# Patient Record
Sex: Female | Born: 1945 | Race: White | Hispanic: No | Marital: Married | Smoking: Never smoker
Health system: Southern US, Community
[De-identification: ages and names within clinical notes are randomized; demographics above are authoritative.]

## PROBLEM LIST (undated history)

## (undated) DIAGNOSIS — G4730 Sleep apnea, unspecified: Secondary | ICD-10-CM

## (undated) DIAGNOSIS — Z8489 Family history of other specified conditions: Secondary | ICD-10-CM

## (undated) DIAGNOSIS — M1990 Unspecified osteoarthritis, unspecified site: Secondary | ICD-10-CM

## (undated) DIAGNOSIS — I480 Paroxysmal atrial fibrillation: Secondary | ICD-10-CM

## (undated) DIAGNOSIS — Z98811 Dental restoration status: Secondary | ICD-10-CM

## (undated) DIAGNOSIS — E039 Hypothyroidism, unspecified: Secondary | ICD-10-CM

## (undated) DIAGNOSIS — J45909 Unspecified asthma, uncomplicated: Secondary | ICD-10-CM

## (undated) DIAGNOSIS — S82853A Displaced trimalleolar fracture of unspecified lower leg, initial encounter for closed fracture: Secondary | ICD-10-CM

## (undated) HISTORY — PX: ABDOMINAL HYSTERECTOMY: SHX81

## (undated) HISTORY — PX: TURBINATE REDUCTION: SHX6157

## (undated) HISTORY — PX: CATARACT EXTRACTION W/ INTRAOCULAR LENS  IMPLANT, BILATERAL: SHX1307

---

## 1999-01-22 ENCOUNTER — Other Ambulatory Visit: Admission: RE | Admit: 1999-01-22 | Discharge: 1999-01-22 | Payer: Self-pay | Admitting: Gynecology

## 1999-03-02 ENCOUNTER — Encounter: Admission: RE | Admit: 1999-03-02 | Discharge: 1999-03-02 | Payer: Self-pay | Admitting: Gynecology

## 1999-03-02 ENCOUNTER — Encounter: Payer: Self-pay | Admitting: Gynecology

## 2000-05-28 ENCOUNTER — Encounter: Payer: Self-pay | Admitting: Gynecology

## 2000-05-28 ENCOUNTER — Other Ambulatory Visit: Admission: RE | Admit: 2000-05-28 | Discharge: 2000-05-28 | Payer: Self-pay | Admitting: Gynecology

## 2000-05-28 ENCOUNTER — Encounter: Admission: RE | Admit: 2000-05-28 | Discharge: 2000-05-28 | Payer: Self-pay | Admitting: Gynecology

## 2000-12-30 ENCOUNTER — Encounter: Payer: Self-pay | Admitting: Gynecology

## 2000-12-30 ENCOUNTER — Encounter: Admission: RE | Admit: 2000-12-30 | Discharge: 2000-12-30 | Payer: Self-pay | Admitting: Gynecology

## 2001-11-19 ENCOUNTER — Encounter (INDEPENDENT_AMBULATORY_CARE_PROVIDER_SITE_OTHER): Payer: Self-pay | Admitting: Specialist

## 2001-11-19 ENCOUNTER — Ambulatory Visit (HOSPITAL_COMMUNITY): Admission: RE | Admit: 2001-11-19 | Discharge: 2001-11-19 | Payer: Self-pay | Admitting: Gastroenterology

## 2002-03-17 ENCOUNTER — Encounter: Admission: RE | Admit: 2002-03-17 | Discharge: 2002-03-17 | Payer: Self-pay | Admitting: Gynecology

## 2002-03-17 ENCOUNTER — Encounter: Payer: Self-pay | Admitting: Gynecology

## 2002-03-17 ENCOUNTER — Other Ambulatory Visit: Admission: RE | Admit: 2002-03-17 | Discharge: 2002-03-17 | Payer: Self-pay | Admitting: Gynecology

## 2003-03-21 ENCOUNTER — Other Ambulatory Visit: Admission: RE | Admit: 2003-03-21 | Discharge: 2003-03-21 | Payer: Self-pay | Admitting: Gynecology

## 2003-03-22 ENCOUNTER — Encounter: Admission: RE | Admit: 2003-03-22 | Discharge: 2003-03-22 | Payer: Self-pay | Admitting: Gynecology

## 2004-03-21 ENCOUNTER — Other Ambulatory Visit: Admission: RE | Admit: 2004-03-21 | Discharge: 2004-03-21 | Payer: Self-pay | Admitting: Gynecology

## 2004-03-22 ENCOUNTER — Encounter: Admission: RE | Admit: 2004-03-22 | Discharge: 2004-03-22 | Payer: Self-pay | Admitting: Gynecology

## 2005-03-25 ENCOUNTER — Other Ambulatory Visit: Admission: RE | Admit: 2005-03-25 | Discharge: 2005-03-25 | Payer: Self-pay | Admitting: Gynecology

## 2005-03-29 ENCOUNTER — Encounter: Admission: RE | Admit: 2005-03-29 | Discharge: 2005-03-29 | Payer: Self-pay | Admitting: Gynecology

## 2005-04-17 ENCOUNTER — Encounter: Admission: RE | Admit: 2005-04-17 | Discharge: 2005-04-17 | Payer: Self-pay | Admitting: Gynecology

## 2006-04-01 ENCOUNTER — Encounter: Admission: RE | Admit: 2006-04-01 | Discharge: 2006-04-01 | Payer: Self-pay | Admitting: Gynecology

## 2006-04-03 ENCOUNTER — Other Ambulatory Visit: Admission: RE | Admit: 2006-04-03 | Discharge: 2006-04-03 | Payer: Self-pay | Admitting: Gynecology

## 2007-04-17 ENCOUNTER — Encounter: Admission: RE | Admit: 2007-04-17 | Discharge: 2007-04-17 | Payer: Self-pay | Admitting: Family Medicine

## 2008-05-09 ENCOUNTER — Encounter: Admission: RE | Admit: 2008-05-09 | Discharge: 2008-05-09 | Payer: Self-pay | Admitting: Family Medicine

## 2009-05-17 ENCOUNTER — Encounter: Admission: RE | Admit: 2009-05-17 | Discharge: 2009-05-17 | Payer: Self-pay | Admitting: Gynecology

## 2010-05-03 ENCOUNTER — Other Ambulatory Visit: Payer: Self-pay | Admitting: *Deleted

## 2010-05-03 DIAGNOSIS — Z1231 Encounter for screening mammogram for malignant neoplasm of breast: Secondary | ICD-10-CM

## 2010-05-29 ENCOUNTER — Ambulatory Visit
Admission: RE | Admit: 2010-05-29 | Discharge: 2010-05-29 | Disposition: A | Discharge status: Home / Self Care | Payer: BC Managed Care – PPO | Source: Ambulatory Visit | Attending: *Deleted | Admitting: *Deleted

## 2010-05-29 DIAGNOSIS — Z1231 Encounter for screening mammogram for malignant neoplasm of breast: Secondary | ICD-10-CM

## 2010-05-31 ENCOUNTER — Other Ambulatory Visit: Payer: Self-pay | Admitting: Gynecology

## 2010-05-31 DIAGNOSIS — R928 Other abnormal and inconclusive findings on diagnostic imaging of breast: Secondary | ICD-10-CM

## 2010-06-25 ENCOUNTER — Ambulatory Visit
Admission: RE | Admit: 2010-06-25 | Discharge: 2010-06-25 | Disposition: A | Discharge status: Home / Self Care | Payer: BC Managed Care – PPO | Source: Ambulatory Visit | Attending: Gynecology | Admitting: Gynecology

## 2010-06-25 DIAGNOSIS — R928 Other abnormal and inconclusive findings on diagnostic imaging of breast: Secondary | ICD-10-CM

## 2010-07-27 NOTE — Op Note (Signed)
   NAMEMIKERIA, Whitney Ortiz                             ACCOUNT NO.:  [id number]   MEDICAL RECORD NO.:  [id number]                   PATIENT TYPE:  AMB   LOCATION:  ENDO                                 FACILITY:  [facility]   PHYSICIAN:  James L. Malon Kindle., M.D.          DATE OF BIRTH:  Jun 16, 1945   DATE OF PROCEDURE:  11/19/2001  DATE OF DISCHARGE:                                 OPERATIVE REPORT   PROCEDURE:  Colonoscopy and coagulation of polyps.   MEDICATIONS:  Fentanyl 60 mcg, Versed 6 mg IV.   SCOPE:  Olympus pediatric video colonoscope.   INDICATIONS:  Patient with a strong family history of colon cancer.   DESCRIPTION OF PROCEDURE:  The procedure had been explained to the patient  and consent obtained.  With the patient in the left lateral decubitus  position, the Olympus pediatric video colonoscope inserted and advanced  under direct visualization.  The prep was excellent, and we were able to  reach the cecum without difficulty.  The ileocecal valve and appendiceal  orifice were seen.  The scope was withdrawn, and the cecum, ascending colon,  hepatic flexure, transverse colon, splenic flexure, descending colon were  seen well, a 2-3 mm polyp in the descending colon was cauterized.  No other  lesions seen.  No other polyps seen in the descending or sigmoid colon.  The  rectum was free of polyps.  The scope was withdrawn, and the patient  tolerated the procedure well.   ASSESSMENT:  Descending colon polyp, cauterized.   PLAN:  Check pathology.  Will repeat in either three or five years.  Routine  postpolypectomy instructions.                                               James L. Malon Kindle., M.D.    Waldron Session  D:  11/19/2001  T:  11/20/2001  Job:  [id number]   cc:   Beatrice Lecher, M.D.  [address] [place]  [place] [address]  Fax: [phone]   Al N. Lendon Colonel, M.D.

## 2011-05-30 ENCOUNTER — Other Ambulatory Visit: Payer: Self-pay | Admitting: Gynecology

## 2011-05-30 DIAGNOSIS — Z1231 Encounter for screening mammogram for malignant neoplasm of breast: Secondary | ICD-10-CM

## 2011-06-26 ENCOUNTER — Ambulatory Visit
Admission: RE | Admit: 2011-06-26 | Discharge: 2011-06-26 | Disposition: A | Discharge status: Home / Self Care | Payer: Medicare Other | Source: Ambulatory Visit | Attending: Gynecology | Admitting: Gynecology

## 2011-06-26 DIAGNOSIS — Z1231 Encounter for screening mammogram for malignant neoplasm of breast: Secondary | ICD-10-CM

## 2013-09-29 DIAGNOSIS — M159 Polyosteoarthritis, unspecified: Secondary | ICD-10-CM | POA: Insufficient documentation

## 2013-09-29 DIAGNOSIS — E039 Hypothyroidism, unspecified: Secondary | ICD-10-CM | POA: Insufficient documentation

## 2013-09-29 DIAGNOSIS — J309 Allergic rhinitis, unspecified: Secondary | ICD-10-CM | POA: Insufficient documentation

## 2013-09-29 DIAGNOSIS — E663 Overweight: Secondary | ICD-10-CM | POA: Insufficient documentation

## 2013-10-19 ENCOUNTER — Encounter (HOSPITAL_COMMUNITY): Payer: Self-pay | Admitting: Pharmacy Technician

## 2013-10-20 NOTE — H&P (Signed)
TOTAL KNEE ADMISSION H&P  Patient is being admitted for bilateral total knee arthroplasties.  Subjective:  Chief Complaint:     Bilateral knee OA / pain.  HPI: Whitney Ortiz, 68 y.o. female, has a history of pain and functional disability in bilateral knees due to arthritis and has failed non-surgical conservative treatments for greater than 12 weeks to include NSAID's and/or analgesics, corticosteriod injections, use of assistive devices and activity modification.  Onset of symptoms was gradual, starting 5+ years ago with gradually worsening course since that time. The patient noted no past surgery on the bilaterally knee(s).  Patient currently rates pain in the bilaterally knee(s) at 8 out of 10 with activity. Patient has night pain, worsening of pain with activity and weight bearing, pain that interferes with activities of daily living, pain with passive range of motion, crepitus and joint swelling.  Patient has evidence of periarticular osteophytes and joint space narrowing by imaging studies. There is no active infection.   Risks, benefits and expectations were discussed with the patient.  Risks including but not limited to the risk of anesthesia, blood clots, nerve damage, blood vessel damage, failure of the prosthesis, infection and up to and including death.  Patient understand the risks, benefits and expectations and wishes to proceed with surgery.   PCP: No primary provider on file.  D/C Plans:       SNF - [facility]  Post-op Meds:       No Rx given  Tranexamic Acid:      To be given - IV    Decadron:      Is to be given  FYI:     Xarelto - to start 6 hours after epidural removed.  Oxycodone    Past Medical History  Diagnosis Date  . Hypothyroidism   . Arthritis     OA AND PAIN IN BOTH KNEES AND "[place]"  . H/O seasonal allergies     THAT TURN INTO SINUS INFECTIONS     Past Surgical History  Procedure Laterality Date  . Abdominal hysterectomy    . Eye surgery       BILATERAL CATARACT EXTRACTIONS WITH LENS IMPLANTS  . Nasal sinus surgery  2015     Allergies  Allergen Reactions  . Penicillins Rash     History  Substance Use Topics  . Smoking status: Never Smoker   . Smokeless tobacco: Never Used  . Alcohol Use: Yes     Comment: MAYBE ONE DRINK PER MONTH       Review of Systems  Constitutional: Positive for weight loss (dieting).  HENT: Negative.   Eyes: Negative.   Respiratory: Negative.   Cardiovascular: Negative.   Gastrointestinal: Negative.   Genitourinary: Positive for frequency.  Musculoskeletal: Positive for joint pain.  Skin: Negative.   Neurological: Negative.   Endo/Heme/Allergies: Positive for environmental allergies.  Psychiatric/Behavioral: Negative.     Objective:  Physical Exam  Constitutional: She is oriented to person, place, and time. She appears well-developed and well-nourished.  HENT:  Head: Normocephalic and atraumatic.  Mouth/Throat: Oropharynx is clear and moist.  Eyes: Pupils are equal, round, and reactive to light.  Neck: Neck supple. No JVD present. No tracheal deviation present. No thyromegaly present.  Cardiovascular: Normal rate, regular rhythm, normal heart sounds and intact distal pulses.   Respiratory: Effort normal and breath sounds normal. No stridor. No respiratory distress. She has no wheezes.  GI: Soft. There is no tenderness. There is no guarding.  Musculoskeletal:  Right knee: She exhibits decreased range of motion, swelling and bony tenderness. She exhibits no ecchymosis, no deformity, no laceration, no erythema and normal alignment. Tenderness found.       Left knee: She exhibits decreased range of motion, swelling and bony tenderness. She exhibits no deformity, no laceration, no erythema and normal alignment. Tenderness found.  Lymphadenopathy:    She has no cervical adenopathy.  Neurological: She is alert and oriented to person, place, and time.  Skin: Skin is warm and dry.   Psychiatric: She has a normal mood and affect.     Imaging Review Plain radiographs demonstrate severe degenerative joint disease of the bilateral knee(s). The overall alignment is neutral. The bone quality appears to be good for age and reported activity level.  Assessment/Plan:  End stage arthritis, bilateral knees   The patient history, physical examination, clinical judgment of the provider and imaging studies are consistent with end stage degenerative joint disease of the bilaterally knee(s) and total knee arthroplasty is deemed medically necessary. The treatment options including medical management, injection therapy arthroscopy and arthroplasty were discussed at length. The risks and benefits of total knee arthroplasty were presented and reviewed. The risks due to aseptic loosening, infection, stiffness, patella tracking problems, thromboembolic complications and other imponderables were discussed. The patient acknowledged the explanation, agreed to proceed with the plan and consent was signed. Patient is being admitted for inpatient treatment for surgery, pain control, PT, OT, prophylactic antibiotics, VTE prophylaxis, progressive ambulation and ADL's and discharge planning. The patient is planning to be discharged to skilled nursing facility.     [place] Pugh Marcellous Snarski   PA-C  10/20/2013, 1:06 PM

## 2013-10-22 ENCOUNTER — Encounter (HOSPITAL_COMMUNITY)
Admission: RE | Admit: 2013-10-22 | Discharge: 2013-10-22 | Disposition: A | Discharge status: Home / Self Care | Payer: Medicare Other | Source: Ambulatory Visit | Attending: Orthopedic Surgery | Admitting: Orthopedic Surgery

## 2013-10-22 ENCOUNTER — Encounter (HOSPITAL_COMMUNITY): Payer: Self-pay

## 2013-10-22 ENCOUNTER — Encounter (INDEPENDENT_AMBULATORY_CARE_PROVIDER_SITE_OTHER): Payer: Self-pay

## 2013-10-22 DIAGNOSIS — Z01812 Encounter for preprocedural laboratory examination: Secondary | ICD-10-CM | POA: Insufficient documentation

## 2013-10-22 DIAGNOSIS — Z01818 Encounter for other preprocedural examination: Secondary | ICD-10-CM | POA: Diagnosis not present

## 2013-10-22 HISTORY — DX: Unspecified osteoarthritis, unspecified site: M19.90

## 2013-10-22 HISTORY — DX: Hypothyroidism, unspecified: E03.9

## 2013-10-22 LAB — BASIC METABOLIC PANEL
Anion gap: 14 (ref 5–15)
BUN: 19 mg/dL (ref 6–23)
CO2: 23 mEq/L (ref 19–32)
CREATININE: 0.98 mg/dL (ref 0.50–1.10)
Calcium: 9.5 mg/dL (ref 8.4–10.5)
Chloride: 104 mEq/L (ref 96–112)
GFR calc Af Amer: 68 mL/min — ABNORMAL LOW (ref >90)
GFR calc non Af Amer: 58 mL/min — ABNORMAL LOW (ref >90)
GLUCOSE: 88 mg/dL (ref 70–99)
Potassium: 3.9 mEq/L (ref 3.7–5.3)
Sodium: 141 mEq/L (ref 137–147)

## 2013-10-22 LAB — APTT: aPTT: 33 seconds (ref 24–37)

## 2013-10-22 LAB — CBC
HCT: 42.9 % (ref 36.0–46.0)
Hemoglobin: 14.5 g/dL (ref 12.0–15.0)
MCH: 30.3 pg (ref 26.0–34.0)
MCHC: 33.8 g/dL (ref 30.0–36.0)
MCV: 89.7 fL (ref 78.0–100.0)
PLATELETS: 231 10*3/uL (ref 150–400)
RBC: 4.78 MIL/uL (ref 3.87–5.11)
RDW: 13.3 % (ref 11.5–15.5)
WBC: 7.7 10*3/uL (ref 4.0–10.5)

## 2013-10-22 LAB — PROTIME-INR
INR: 1.05 (ref 0.00–1.49)
Prothrombin Time: 13.7 seconds (ref 11.6–15.2)

## 2013-10-22 LAB — URINALYSIS, ROUTINE W REFLEX MICROSCOPIC
GLUCOSE, UA: NEGATIVE mg/dL
HGB URINE DIPSTICK: NEGATIVE
Ketones, ur: NEGATIVE mg/dL
Leukocytes, UA: NEGATIVE
Nitrite: NEGATIVE
PROTEIN: NEGATIVE mg/dL
Specific Gravity, Urine: 1.026 (ref 1.005–1.030)
Urobilinogen, UA: 0.2 mg/dL (ref 0.0–1.0)
pH: 6 (ref 5.0–8.0)

## 2013-10-22 LAB — SURGICAL PCR SCREEN
MRSA, PCR: NEGATIVE
STAPHYLOCOCCUS AUREUS: NEGATIVE

## 2013-10-22 NOTE — Pre-Procedure Instructions (Signed)
CXR AND EKG NOT NEEDED PER ANESTHESIOLOGIST'S GUIDELINES.

## 2013-10-22 NOTE — Patient Instructions (Signed)
YOUR SURGERY IS SCHEDULED AT [facility]  ON:  Monday  8/24  REPORT TO  SHORT STAY CENTER AT:  7:30 AM   PLEASE COME IN THE [facility] MAIN HOSPITAL ENTRANCE AND FOLLOW SIGNS TO SHORT STAY CENTER.  DO NOT EAT OR DRINK ANYTHING AFTER MIDNIGHT THE NIGHT BEFORE YOUR SURGERY.  YOU MAY BRUSH YOUR TEETH, RINSE OUT YOUR MOUTH--BUT NO WATER, NO FOOD, NO CHEWING GUM, NO MINTS, NO CANDIES, NO CHEWING TOBACCO.  PLEASE TAKE THE FOLLOWING MEDICATIONS THE AM OF YOUR SURGERY WITH A FEW SIPS OF WATER:  ARMOUR THYROID   DO NOT BRING VALUABLES, MONEY, CREDIT CARDS.  DO NOT WEAR JEWELRY, MAKE-UP, NAIL POLISH AND NO METAL PINS OR CLIPS IN YOUR HAIR. CONTACT LENS, DENTURES / PARTIALS, GLASSES SHOULD NOT BE WORN TO SURGERY AND IN MOST CASES-HEARING AIDS WILL NEED TO BE REMOVED.  BRING YOUR GLASSES CASE, ANY EQUIPMENT NEEDED FOR YOUR CONTACT LENS. FOR PATIENTS ADMITTED TO THE HOSPITAL--CHECK OUT TIME THE DAY OF DISCHARGE IS 11:00 AM.  ALL INPATIENT ROOMS ARE PRIVATE - WITH BATHROOM, TELEPHONE, TELEVISION AND WIFI INTERNET.                                                    PLEASE READ OVER ANY  FACT SHEETS THAT YOU WERE GIVEN: MRSA INFORMATION, BLOOD TRANSFUSION INFORMATION, INCENTIVE SPIROMETER INFORMATION.  PLEASE BE AWARE THAT YOU MAY NEED ADDITIONAL BLOOD DRAWN DAY OF YOUR SURGERY  _______________________________________________________________________   [facility] - Preparing for Surgery Before surgery, you can play an important role.  Because skin is not sterile, your skin needs to be as free of germs as possible.  You can reduce the number of germs on your skin by washing with CHG (chlorahexidine gluconate) soap before surgery.  CHG is an antiseptic cleaner which kills germs and bonds with the skin to continue killing germs even after washing. Please DO NOT use if you have an allergy to CHG or antibacterial soaps.  If your skin becomes reddened/irritated stop using the CHG and inform your  nurse when you arrive at Short Stay. Do not shave (including legs and underarms) for at least 48 hours prior to the first CHG shower.  You may shave your face/neck. Please follow these instructions carefully:  1.  Shower with CHG Soap the night before surgery and the  morning of Surgery.  2.  If you choose to wash your hair, wash your hair first as usual with your  normal  shampoo.  3.  After you shampoo, rinse your hair and body thoroughly to remove the  shampoo.                           4.  Use CHG as you would any other liquid soap.  You can apply chg directly  to the skin and wash                       Gently with a scrungie or clean washcloth.  5.  Apply the CHG Soap to your body ONLY FROM THE NECK DOWN.   Do not use on face/ open                           Wound or  open sores. Avoid contact with eyes, ears mouth and genitals (private parts).                       Wash face,  Genitals (private parts) with your normal soap.             6.  Wash thoroughly, paying special attention to the area where your surgery  will be performed.  7.  Thoroughly rinse your body with warm water from the neck down.  8.  DO NOT shower/wash with your normal soap after using and rinsing off  the CHG Soap.                9.  Pat yourself dry with a clean towel.            10.  Wear clean pajamas.            11.  Place clean sheets on your bed the night of your first shower and do not  sleep with pets. Day of Surgery : Do not apply any lotions/deodorants the morning of surgery.  Please wear clean clothes to the hospital/surgery center.  FAILURE TO FOLLOW THESE INSTRUCTIONS MAY RESULT IN THE CANCELLATION OF YOUR SURGERY PATIENT SIGNATURE_________________________________  NURSE SIGNATURE__________________________________  ________________________________________________________________________   Whitney Ortiz  An incentive spirometer is a tool that can help keep your lungs clear and active. This tool  measures how well you are filling your lungs with each breath. Taking long deep breaths may help reverse or decrease the chance of developing breathing (pulmonary) problems (especially infection) following:  A long period of time when you are unable to move or be active. BEFORE THE PROCEDURE   If the spirometer includes an indicator to show your best effort, your nurse or respiratory therapist will set it to a desired goal.  If possible, sit up straight or lean slightly forward. Try not to slouch.  Hold the incentive spirometer in an upright position. INSTRUCTIONS FOR USE  1. Sit on the edge of your bed if possible, or sit up as far as you can in bed or on a chair. 2. Hold the incentive spirometer in an upright position. 3. Breathe out normally. 4. Place the mouthpiece in your mouth and seal your lips tightly around it. 5. Breathe in slowly and as deeply as possible, raising the piston or the ball toward the top of the column. 6. Hold your breath for 3-5 seconds or for as long as possible. Allow the piston or ball to fall to the bottom of the column. 7. Remove the mouthpiece from your mouth and breathe out normally. 8. Rest for a few seconds and repeat Steps 1 through 7 at least 10 times every 1-2 hours when you are awake. Take your time and take a few normal breaths between deep breaths. 9. The spirometer may include an indicator to show your best effort. Use the indicator as a goal to work toward during each repetition. 10. After each set of 10 deep breaths, practice coughing to be sure your lungs are clear. If you have an incision (the cut made at the time of surgery), support your incision when coughing by placing a pillow or rolled up towels firmly against it. Once you are able to get out of bed, walk around indoors and cough well. You may stop using the incentive spirometer when instructed by your caregiver.  RISKS AND COMPLICATIONS  Take your time so you do not get dizzy or  light-headed.  If you are in pain, you may need to take or ask for pain medication before doing incentive spirometry. It is harder to take a deep breath if you are having pain. AFTER USE  Rest and breathe slowly and easily.  It can be helpful to keep track of a log of your progress. Your caregiver can provide you with a simple table to help with this. If you are using the spirometer at home, follow these instructions: [place] IF:   You are having difficultly using the spirometer.  You have trouble using the spirometer as often as instructed.  Your pain medication is not giving enough relief while using the spirometer.  You develop fever of 100.5 F (38.1 C) or higher. SEEK IMMEDIATE MEDICAL CARE IF:   You cough up bloody sputum that had not been present before.  You develop fever of 102 F (38.9 C) or greater.  You develop worsening pain at or near the incision site. MAKE SURE YOU:   Understand these instructions.  Will watch your condition.  Will get help right away if you are not doing well or get worse. Document Released: 07/08/2006 Document Revised: 05/20/2011 Document Reviewed: 09/08/2006 Exit[employer] Patient Information 2014 [employer], [place].   ________________________________________________________________________  WHAT IS A BLOOD TRANSFUSION? Blood Transfusion Information  A transfusion is the replacement of blood or some of its parts. Blood is made up of multiple cells which provide different functions.  Red blood cells carry oxygen and are used for blood loss replacement.  White blood cells fight against infection.  Platelets control bleeding.  Plasma helps clot blood.  Other blood products are available for specialized needs, such as hemophilia or other clotting disorders. BEFORE THE TRANSFUSION  Who gives blood for transfusions?   Healthy volunteers who are fully evaluated to make sure their blood is safe. This is blood bank  blood. Transfusion therapy is the safest it has ever been in the practice of medicine. Before blood is taken from a donor, a complete history is taken to make sure that person has no history of diseases nor engages in risky social behavior (examples are intravenous drug use or sexual activity with multiple partners). The donor's travel history is screened to minimize risk of transmitting infections, such as malaria. The donated blood is tested for signs of infectious diseases, such as HIV and hepatitis. The blood is then tested to be sure it is compatible with you in order to minimize the chance of a transfusion reaction. If you or a relative donates blood, this is often done in anticipation of surgery and is not appropriate for emergency situations. It takes many days to process the donated blood. RISKS AND COMPLICATIONS Although transfusion therapy is very safe and saves many lives, the main dangers of transfusion include:   Getting an infectious disease.  Developing a transfusion reaction. This is an allergic reaction to something in the blood you were given. Every precaution is taken to prevent this. The decision to have a blood transfusion has been considered carefully by your caregiver before blood is given. Blood is not given unless the benefits outweigh the risks. AFTER THE TRANSFUSION  Right after receiving a blood transfusion, you will usually feel much better and more energetic. This is especially true if your red blood cells have gotten low (anemic). The transfusion raises the level of the red blood cells which carry oxygen, and this usually causes an energy increase.  The nurse administering the transfusion will monitor you carefully  for complications. HOME CARE INSTRUCTIONS  No special instructions are needed after a transfusion. You may find your energy is better. Speak with your caregiver about any limitations on activity for underlying diseases you may have. SEEK MEDICAL CARE IF:    Your condition is not improving after your transfusion.  You develop redness or irritation at the intravenous (IV) site. SEEK IMMEDIATE MEDICAL CARE IF:  Any of the following symptoms occur over the next 12 hours:  Shaking chills.  You have a temperature by mouth above 102 F (38.9 C), not controlled by medicine.  Chest, back, or muscle pain.  People around you feel you are not acting correctly or are confused.  Shortness of breath or difficulty breathing.  Dizziness and fainting.  You get a rash or develop hives.  You have a decrease in urine output.  Your urine turns a dark color or changes to pink, red, or brown. Any of the following symptoms occur over the next 10 days:  You have a temperature by mouth above 102 F (38.9 C), not controlled by medicine.  Shortness of breath.  Weakness after normal activity.  The white part of the eye turns yellow (jaundice).  You have a decrease in the amount of urine or are urinating less often.  Your urine turns a dark color or changes to pink, red, or brown. Document Released: 02/23/2000 Document Revised: 05/20/2011 Document Reviewed: 10/12/2007 [facility] Patient Information 2014 [place], [place].  _______________________________________________________________________

## 2013-11-01 ENCOUNTER — Encounter (HOSPITAL_COMMUNITY)
Admission: RE | Disposition: A | Discharge status: Skilled Nursing Facility | Payer: Self-pay | Source: Ambulatory Visit | Attending: Orthopedic Surgery

## 2013-11-01 ENCOUNTER — Inpatient Hospital Stay (HOSPITAL_COMMUNITY)
Admission: RE | Admit: 2013-11-01 | Discharge: 2013-11-03 | DRG: 470 | Disposition: A | Discharge status: Skilled Nursing Facility | Payer: Medicare Other | Source: Ambulatory Visit | Attending: Orthopedic Surgery | Admitting: Orthopedic Surgery

## 2013-11-01 ENCOUNTER — Inpatient Hospital Stay (HOSPITAL_COMMUNITY): Payer: Medicare Other | Admitting: Anesthesiology

## 2013-11-01 ENCOUNTER — Encounter (HOSPITAL_COMMUNITY): Payer: Self-pay | Admitting: *Deleted

## 2013-11-01 ENCOUNTER — Encounter (HOSPITAL_COMMUNITY): Payer: Medicare Other | Admitting: Anesthesiology

## 2013-11-01 DIAGNOSIS — M25569 Pain in unspecified knee: Secondary | ICD-10-CM | POA: Diagnosis present

## 2013-11-01 DIAGNOSIS — Z6836 Body mass index (BMI) 36.0-36.9, adult: Secondary | ICD-10-CM | POA: Diagnosis not present

## 2013-11-01 DIAGNOSIS — Z961 Presence of intraocular lens: Secondary | ICD-10-CM | POA: Diagnosis not present

## 2013-11-01 DIAGNOSIS — D62 Acute posthemorrhagic anemia: Secondary | ICD-10-CM | POA: Diagnosis not present

## 2013-11-01 DIAGNOSIS — M25469 Effusion, unspecified knee: Secondary | ICD-10-CM | POA: Diagnosis present

## 2013-11-01 DIAGNOSIS — E669 Obesity, unspecified: Secondary | ICD-10-CM | POA: Diagnosis present

## 2013-11-01 DIAGNOSIS — Z880 Allergy status to penicillin: Secondary | ICD-10-CM | POA: Diagnosis not present

## 2013-11-01 DIAGNOSIS — M1710 Unilateral primary osteoarthritis, unspecified knee: Secondary | ICD-10-CM | POA: Diagnosis present

## 2013-11-01 DIAGNOSIS — Z01812 Encounter for preprocedural laboratory examination: Secondary | ICD-10-CM

## 2013-11-01 DIAGNOSIS — Z96653 Presence of artificial knee joint, bilateral: Secondary | ICD-10-CM

## 2013-11-01 DIAGNOSIS — M6580 Other synovitis and tenosynovitis, unspecified site: Secondary | ICD-10-CM | POA: Diagnosis present

## 2013-11-01 DIAGNOSIS — M898X9 Other specified disorders of bone, unspecified site: Secondary | ICD-10-CM | POA: Diagnosis present

## 2013-11-01 DIAGNOSIS — E039 Hypothyroidism, unspecified: Secondary | ICD-10-CM | POA: Diagnosis present

## 2013-11-01 HISTORY — PX: TOTAL KNEE ARTHROPLASTY: SHX125

## 2013-11-01 LAB — ABO/RH: ABO/RH(D): O NEG

## 2013-11-01 LAB — TYPE AND SCREEN
ABO/RH(D): O NEG
Antibody Screen: NEGATIVE

## 2013-11-01 SURGERY — ARTHROPLASTY, KNEE, BILATERAL, TOTAL
Anesthesia: General | Site: Knee | Laterality: Bilateral

## 2013-11-01 MED ORDER — MIDAZOLAM HCL 2 MG/2ML IJ SOLN
INTRAMUSCULAR | Status: AC
  Filled 2013-11-01: qty 2

## 2013-11-01 MED ORDER — MAGNESIUM CITRATE PO SOLN: 1.0000 | Freq: Once | ORAL | Status: AC | PRN

## 2013-11-01 MED ORDER — FENTANYL CITRATE 0.05 MG/ML IJ SOLN
INTRAMUSCULAR | Status: AC
  Filled 2013-11-01: qty 5

## 2013-11-01 MED ORDER — CEFAZOLIN SODIUM-DEXTROSE 2-3 GM-% IV SOLR
2.0000 g | INTRAVENOUS | Status: AC
  Administered 2013-11-01: 2 g via INTRAVENOUS

## 2013-11-01 MED ORDER — FENTANYL CITRATE 0.05 MG/ML IJ SOLN
INTRAMUSCULAR | Status: DC | PRN
  Administered 2013-11-01 (×5): 50 ug via INTRAVENOUS

## 2013-11-01 MED ORDER — METHOCARBAMOL 1000 MG/10ML IJ SOLN
500.0000 mg | Freq: Four times a day (QID) | INTRAVENOUS | Status: DC | PRN
  Filled 2013-11-01: qty 5

## 2013-11-01 MED ORDER — DOCUSATE SODIUM 100 MG PO CAPS
100.0000 mg | ORAL_CAPSULE | Freq: Two times a day (BID) | ORAL | Status: DC
  Administered 2013-11-01 – 2013-11-03 (×4): 100 mg via ORAL

## 2013-11-01 MED ORDER — BUPIVACAINE HCL (PF) 0.25 % IJ SOLN
INTRAMUSCULAR | Status: DC | PRN
  Administered 2013-11-01: 5 mL via EPIDURAL
  Administered 2013-11-01: 5 mL

## 2013-11-01 MED ORDER — CEFAZOLIN SODIUM-DEXTROSE 2-3 GM-% IV SOLR
INTRAVENOUS | Status: AC
  Filled 2013-11-01: qty 50

## 2013-11-01 MED ORDER — ALUM & MAG HYDROXIDE-SIMETH 200-200-20 MG/5ML PO SUSP: 30.0000 mL | ORAL | Status: DC | PRN

## 2013-11-01 MED ORDER — GLYCOPYRROLATE 0.2 MG/ML IJ SOLN
INTRAMUSCULAR | Status: DC | PRN
  Administered 2013-11-01 (×2): 0.2 mg via INTRAVENOUS

## 2013-11-01 MED ORDER — BUPIVACAINE LIPOSOME 1.3 % IJ SUSP: 20.0000 mL | Freq: Once | INTRAMUSCULAR | Status: DC

## 2013-11-01 MED ORDER — CHLORHEXIDINE GLUCONATE 4 % EX LIQD: 60.0000 mL | Freq: Once | CUTANEOUS | Status: DC

## 2013-11-01 MED ORDER — PROPOFOL 10 MG/ML IV BOLUS
INTRAVENOUS | Status: AC
  Filled 2013-11-01: qty 20

## 2013-11-01 MED ORDER — TRANEXAMIC ACID 100 MG/ML IV SOLN
500.0000 mg | INTRAVENOUS | Status: DC
  Filled 2013-11-01: qty 5

## 2013-11-01 MED ORDER — BUPIVACAINE-EPINEPHRINE (PF) 0.25% -1:200000 IJ SOLN
INTRAMUSCULAR | Status: AC
  Filled 2013-11-01: qty 30

## 2013-11-01 MED ORDER — LACTATED RINGERS IV SOLN: INTRAVENOUS | Status: DC

## 2013-11-01 MED ORDER — METOCLOPRAMIDE HCL 5 MG/ML IJ SOLN: 5.0000 mg | Freq: Three times a day (TID) | INTRAMUSCULAR | Status: DC | PRN

## 2013-11-01 MED ORDER — HYDROMORPHONE HCL PF 1 MG/ML IJ SOLN
INTRAMUSCULAR | Status: DC | PRN
  Administered 2013-11-01 (×2): 0.5 mg via INTRAVENOUS

## 2013-11-01 MED ORDER — CLINDAMYCIN PHOSPHATE 600 MG/50ML IV SOLN
600.0000 mg | Freq: Four times a day (QID) | INTRAVENOUS | Status: AC
  Administered 2013-11-01 (×2): 600 mg via INTRAVENOUS
  Filled 2013-11-01 (×2): qty 50

## 2013-11-01 MED ORDER — PHENOL 1.4 % MT LIQD
1.0000 | OROMUCOSAL | Status: DC | PRN
  Filled 2013-11-01: qty 177

## 2013-11-01 MED ORDER — POLYETHYLENE GLYCOL 3350 17 G PO PACK
17.0000 g | PACK | Freq: Two times a day (BID) | ORAL | Status: DC
  Administered 2013-11-01 – 2013-11-03 (×4): 17 g via ORAL

## 2013-11-01 MED ORDER — METOCLOPRAMIDE HCL 10 MG PO TABS: 5.0000 mg | ORAL_TABLET | Freq: Three times a day (TID) | ORAL | Status: DC | PRN

## 2013-11-01 MED ORDER — EPHEDRINE SULFATE 50 MG/ML IJ SOLN
INTRAMUSCULAR | Status: DC | PRN
  Administered 2013-11-01 (×2): 10 mg via INTRAVENOUS

## 2013-11-01 MED ORDER — PROPOFOL 10 MG/ML IV BOLUS
INTRAVENOUS | Status: DC | PRN
  Administered 2013-11-01: 200 mg via INTRAVENOUS

## 2013-11-01 MED ORDER — HYDROMORPHONE HCL PF 1 MG/ML IJ SOLN: 0.2500 mg | INTRAMUSCULAR | Status: DC | PRN

## 2013-11-01 MED ORDER — ONDANSETRON HCL 4 MG PO TABS: 4.0000 mg | ORAL_TABLET | Freq: Four times a day (QID) | ORAL | Status: DC | PRN

## 2013-11-01 MED ORDER — METHOCARBAMOL 500 MG PO TABS
500.0000 mg | ORAL_TABLET | Freq: Four times a day (QID) | ORAL | Status: DC | PRN
  Administered 2013-11-02 (×2): 500 mg via ORAL
  Filled 2013-11-01 (×2): qty 1

## 2013-11-01 MED ORDER — CELECOXIB 200 MG PO CAPS
200.0000 mg | ORAL_CAPSULE | Freq: Two times a day (BID) | ORAL | Status: DC
  Administered 2013-11-01 – 2013-11-03 (×4): 200 mg via ORAL
  Filled 2013-11-01 (×5): qty 1

## 2013-11-01 MED ORDER — OXYCODONE HCL 5 MG PO TABS
5.0000 mg | ORAL_TABLET | ORAL | Status: DC
  Administered 2013-11-01 – 2013-11-02 (×3): 5 mg via ORAL
  Administered 2013-11-02: 15 mg via ORAL
  Administered 2013-11-02: 5 mg via ORAL
  Administered 2013-11-02 – 2013-11-03 (×6): 15 mg via ORAL
  Filled 2013-11-01 (×2): qty 3
  Filled 2013-11-01: qty 1
  Filled 2013-11-01: qty 3
  Filled 2013-11-01: qty 1
  Filled 2013-11-01: qty 3
  Filled 2013-11-01 (×2): qty 1
  Filled 2013-11-01 (×4): qty 3

## 2013-11-01 MED ORDER — LACTATED RINGERS IV SOLN
INTRAVENOUS | Status: DC
  Administered 2013-11-01: 1000 mL via INTRAVENOUS
  Administered 2013-11-01: 12:00:00 via INTRAVENOUS

## 2013-11-01 MED ORDER — POTASSIUM CHLORIDE 2 MEQ/ML IV SOLN
INTRAVENOUS | Status: DC
  Administered 2013-11-01 – 2013-11-02 (×3): via INTRAVENOUS
  Filled 2013-11-01 (×7): qty 1000

## 2013-11-01 MED ORDER — HYDROMORPHONE HCL PF 2 MG/ML IJ SOLN
INTRAMUSCULAR | Status: AC
  Filled 2013-11-01: qty 1

## 2013-11-01 MED ORDER — MIDAZOLAM HCL 5 MG/5ML IJ SOLN
INTRAMUSCULAR | Status: DC | PRN
  Administered 2013-11-01: 2 mg via INTRAVENOUS

## 2013-11-01 MED ORDER — DEXAMETHASONE SODIUM PHOSPHATE 10 MG/ML IJ SOLN
10.0000 mg | Freq: Once | INTRAMUSCULAR | Status: AC
  Administered 2013-11-01: 10 mg via INTRAVENOUS

## 2013-11-01 MED ORDER — BUPIVACAINE HCL (PF) 0.25 % IJ SOLN
INTRAMUSCULAR | Status: AC
  Filled 2013-11-01: qty 30

## 2013-11-01 MED ORDER — MENTHOL 3 MG MT LOZG
1.0000 | LOZENGE | OROMUCOSAL | Status: DC | PRN
  Filled 2013-11-01: qty 9

## 2013-11-01 MED ORDER — BUPIVACAINE-EPINEPHRINE 0.25% -1:200000 IJ SOLN
INTRAMUSCULAR | Status: DC | PRN
  Administered 2013-11-01 (×2): 15 mL

## 2013-11-01 MED ORDER — TRANEXAMIC ACID 100 MG/ML IV SOLN
1000.0000 mg | Freq: Once | INTRAVENOUS | Status: AC
  Administered 2013-11-01: 500 mg via INTRAVENOUS
  Administered 2013-11-01: 1000 mg via INTRAVENOUS
  Filled 2013-11-01: qty 10

## 2013-11-01 MED ORDER — ONDANSETRON HCL 4 MG/2ML IJ SOLN: 4.0000 mg | Freq: Four times a day (QID) | INTRAMUSCULAR | Status: DC | PRN

## 2013-11-01 MED ORDER — SODIUM CHLORIDE 0.9 % IR SOLN
Status: DC | PRN
  Administered 2013-11-01: 3000 mL

## 2013-11-01 MED ORDER — ONDANSETRON HCL 4 MG/2ML IJ SOLN
INTRAMUSCULAR | Status: DC | PRN
  Administered 2013-11-01: 4 mg via INTRAVENOUS

## 2013-11-01 MED ORDER — SODIUM CHLORIDE 0.9 % IV SOLN
INTRAVENOUS | Status: DC
  Administered 2013-11-01 – 2013-11-02 (×2): via EPIDURAL
  Filled 2013-11-01 (×8): qty 20

## 2013-11-01 MED ORDER — FERROUS SULFATE 325 (65 FE) MG PO TABS
325.0000 mg | ORAL_TABLET | Freq: Three times a day (TID) | ORAL | Status: DC
Start: 2013-11-01 — End: 2013-11-03
  Administered 2013-11-01 – 2013-11-03 (×6): 325 mg via ORAL
  Filled 2013-11-01 (×8): qty 1

## 2013-11-01 MED ORDER — DIPHENHYDRAMINE HCL 25 MG PO CAPS: 25.0000 mg | ORAL_CAPSULE | Freq: Four times a day (QID) | ORAL | Status: DC | PRN

## 2013-11-01 MED ORDER — THYROID 60 MG PO TABS
60.0000 mg | ORAL_TABLET | Freq: Every day | ORAL | Status: DC
  Administered 2013-11-02 – 2013-11-03 (×2): 60 mg via ORAL
  Filled 2013-11-01 (×3): qty 1

## 2013-11-01 MED ORDER — 0.9 % SODIUM CHLORIDE (POUR BTL) OPTIME
TOPICAL | Status: DC | PRN
  Administered 2013-11-01: 1000 mL

## 2013-11-01 MED ORDER — LIDOCAINE HCL (CARDIAC) 20 MG/ML IV SOLN
INTRAVENOUS | Status: DC | PRN
  Administered 2013-11-01: 100 mg via INTRAVENOUS

## 2013-11-01 MED ORDER — HYDROMORPHONE HCL PF 1 MG/ML IJ SOLN: 0.5000 mg | INTRAMUSCULAR | Status: DC | PRN

## 2013-11-01 MED ORDER — DEXAMETHASONE SODIUM PHOSPHATE 10 MG/ML IJ SOLN
10.0000 mg | Freq: Once | INTRAMUSCULAR | Status: AC
  Administered 2013-11-02: 10 mg via INTRAVENOUS
  Filled 2013-11-01: qty 1

## 2013-11-01 MED ORDER — BISACODYL 10 MG RE SUPP
10.0000 mg | Freq: Every day | RECTAL | Status: DC | PRN
Start: 2013-11-01 — End: 2013-11-03

## 2013-11-01 MED ORDER — SUCCINYLCHOLINE CHLORIDE 20 MG/ML IJ SOLN
INTRAMUSCULAR | Status: DC | PRN
  Administered 2013-11-01: 100 mg via INTRAVENOUS

## 2013-11-01 SURGICAL SUPPLY — 59 items
ADH SKN CLS APL DERMABOND .7 (GAUZE/BANDAGES/DRESSINGS) ×2
BAG SPEC THK2 15X12 ZIP CLS (MISCELLANEOUS) ×1
BAG ZIPLOCK 12X15 (MISCELLANEOUS) ×3 IMPLANT
BANDAGE ELASTIC 6 VELCRO ST LF (GAUZE/BANDAGES/DRESSINGS) ×6 IMPLANT
BANDAGE ESMARK 6X9 LF (GAUZE/BANDAGES/DRESSINGS) ×1 IMPLANT
BLADE SAW RECIPROCATING 77.5 (BLADE) ×6 IMPLANT
BLADE SAW SGTL 13.0X1.19X90.0M (BLADE) ×6 IMPLANT
BNDG CMPR 9X6 STRL LF SNTH (GAUZE/BANDAGES/DRESSINGS) ×2
BNDG COHESIVE 4X5 TAN STRL (GAUZE/BANDAGES/DRESSINGS) ×3 IMPLANT
BNDG ESMARK 6X9 LF (GAUZE/BANDAGES/DRESSINGS) ×6
BOWL SMART MIX CTS (DISPOSABLE) ×6 IMPLANT
CAPT RP KNEE ×4 IMPLANT
CEMENT HV SMART SET (Cement) ×12 IMPLANT
CUFF TOURN SGL QUICK 34 (TOURNIQUET CUFF) ×6
CUFF TRNQT CYL 34X4X40X1 (TOURNIQUET CUFF) ×2 IMPLANT
DERMABOND ADVANCED (GAUZE/BANDAGES/DRESSINGS) ×4
DERMABOND ADVANCED .7 DNX12 (GAUZE/BANDAGES/DRESSINGS) ×2 IMPLANT
DRAPE EXTREMITY BILATERAL (DRAPE) ×3 IMPLANT
DRAPE POUCH INSTRU U-SHP 10X18 (DRAPES) ×3 IMPLANT
DRAPE U-SHAPE 47X51 STRL (DRAPES) ×9 IMPLANT
DRSG AQUACEL AG ADV 3.5X10 (GAUZE/BANDAGES/DRESSINGS) ×6 IMPLANT
DRSG TEGADERM 4X4.75 (GAUZE/BANDAGES/DRESSINGS) ×6 IMPLANT
DURAPREP 26ML APPLICATOR (WOUND CARE) ×3 IMPLANT
ELECT REM PT RETURN 9FT ADLT (ELECTROSURGICAL) ×3
ELECTRODE REM PT RTRN 9FT ADLT (ELECTROSURGICAL) ×1 IMPLANT
GAUZE SPONGE 2X2 8PLY STRL LF (GAUZE/BANDAGES/DRESSINGS) ×2 IMPLANT
GLOVE BIOGEL PI IND STRL 7.5 (GLOVE) ×1 IMPLANT
GLOVE BIOGEL PI IND STRL 8 (GLOVE) ×1 IMPLANT
GLOVE BIOGEL PI INDICATOR 7.5 (GLOVE) ×2
GLOVE BIOGEL PI INDICATOR 8 (GLOVE) ×2
GLOVE ECLIPSE 8.0 STRL XLNG CF (GLOVE) IMPLANT
GLOVE ORTHO TXT STRL SZ7.5 (GLOVE) ×6 IMPLANT
GOWN SPEC L3 XXLG W/TWL (GOWN DISPOSABLE) ×6 IMPLANT
GOWN STRL REUS W/TWL LRG LVL3 (GOWN DISPOSABLE) ×3 IMPLANT
HANDPIECE INTERPULSE COAX TIP (DISPOSABLE) ×3
IMMOBILIZER KNEE 20 (SOFTGOODS)
IMMOBILIZER KNEE 20 THIGH 36 (SOFTGOODS) ×2 IMPLANT
KIT BASIN OR (CUSTOM PROCEDURE TRAY) ×3 IMPLANT
MANIFOLD NEPTUNE II (INSTRUMENTS) ×3 IMPLANT
NS IRRIG 1000ML POUR BTL (IV SOLUTION) ×4 IMPLANT
PACK TOTAL JOINT (CUSTOM PROCEDURE TRAY) ×3 IMPLANT
PADDING CAST COTTON 6X4 STRL (CAST SUPPLIES) ×2 IMPLANT
POSITIONER SURGICAL ARM (MISCELLANEOUS) ×1 IMPLANT
SET HNDPC FAN SPRY TIP SCT (DISPOSABLE) ×1 IMPLANT
SET PAD KNEE POSITIONER (MISCELLANEOUS) ×6 IMPLANT
SPONGE GAUZE 2X2 STER 10/PKG (GAUZE/BANDAGES/DRESSINGS)
SPONGE LAP 18X18 X RAY DECT (DISPOSABLE) ×7 IMPLANT
STOCKINETTE 8 INCH (MISCELLANEOUS) ×3 IMPLANT
SUCTION FRAZIER 12FR DISP (SUCTIONS) ×3 IMPLANT
SUT MNCRL AB 4-0 PS2 18 (SUTURE) ×6 IMPLANT
SUT VIC AB 1 CT1 27 (SUTURE) ×12
SUT VIC AB 1 CT1 27XBRD ANTBC (SUTURE) ×2 IMPLANT
SUT VIC AB 2-0 CT1 27 (SUTURE) ×18
SUT VIC AB 2-0 CT1 TAPERPNT 27 (SUTURE) ×6 IMPLANT
SUT VLOC 180 0 24IN GS25 (SUTURE) ×6 IMPLANT
TOWEL OR 17X26 10 PK STRL BLUE (TOWEL DISPOSABLE) ×4 IMPLANT
TRAY FOLEY CATH 14FRSI W/METER (CATHETERS) ×3 IMPLANT
WATER STERILE IRR 1500ML POUR (IV SOLUTION) ×3 IMPLANT
WRAP KNEE MAXI GEL POST OP (GAUZE/BANDAGES/DRESSINGS) ×6 IMPLANT

## 2013-11-01 NOTE — Anesthesia Procedure Notes (Signed)
Epidural Patient location during procedure: pre-op Start time: 11/01/2013 9:30 AM End time: 11/01/2013 10:00 AM  Staffing Anesthesiologist: Rod Mae L Performed by: anesthesiologist   Preanesthetic Checklist Completed: patient identified, site marked, surgical consent, pre-op evaluation, timeout performed, IV checked, risks and benefits discussed, monitors and equipment checked and post-op pain management  Epidural Patient position: sitting Prep: Betadine Patient monitoring: heart rate, continuous pulse ox and blood pressure Approach: midline Location: L3-L4 Injection technique: LOR air  Needle:  Needle type: Hustead  Needle gauge: 18 G Needle length: 9 cm and 9 Catheter type: closed end flexible Catheter size: 20 Guage Test dose: negative and 1.5% lidocaine  Assessment Sensory level: T10  Additional Notes Test dose 1.5% Lidocaine with epi 1:200,000  Patient tolerated the insertion well without complications.  The first epidural went in well without fluid return but when placed the catheter it pierced the dura and got CSF back.  Second time the procedure went well higher interspace but could not inject fluid in the catheter.  The third procedure did not have any problems and no CSF was noted in the needle or catheter.  The patient had a small frontal headache from the air used in LOR going in the dural puncture.Reason for block:post-op pain management

## 2013-11-01 NOTE — Transfer of Care (Signed)
Immediate Anesthesia Transfer of Care Note  Patient: Whitney Ortiz  Procedure(s) Performed: Procedure(s) (LRB): TOTAL KNEE BILATERAL (Bilateral)  Patient Location: PACU  Anesthesia Type: General and Epidural  Level of Consciousness: sedated, patient cooperative and responds to stimulation  Airway & Oxygen Therapy: Patient Spontanous Breathing and Patient connected to face mask oxgen  Post-op Assessment: Report given to PACU RN and Post -op Vital signs reviewed and stable  Post vital signs: Reviewed and stable  Complications: No apparent anesthesia complications

## 2013-11-01 NOTE — Op Note (Signed)
NAME:  Whitney Ortiz                      MEDICAL RECORD NO.:  [id number]                             FACILITY:  [facility]      PHYSICIAN:  Pietro Cassis. Alvan Dame, M.D.  DATE OF BIRTH:  Jun 04, 1945      DATE OF PROCEDURE:  11/01/2013                                     OPERATIVE REPORT         PREOPERATIVE DIAGNOSIS:  Bilateral knee osteoarthritis.      POSTOPERATIVE DIAGNOSIS:  Bilateral knee osteoarthritis.      FINDINGS:  The patient was noted to have complete loss of cartilage and   bone-on-bone arthritis with associated osteophytes in the medial and patellofemoral compartments of   the knee with a significant synovitis and associated effusion.      PROCEDURE:  Left total knee replacement.      COMPONENTS USED:  DePuy rotating platform posterior stabilized knee   system, a size 4n femur, 4 tibia, 10 mm insert, and 38 patellar   button.      SURGEON:  Pietro Cassis. Alvan Dame, M.D.      ASSISTANT:  Danae Orleans, PA-C.      ANESTHESIA:  Epidural.      SPECIMENS:  None.      COMPLICATION:  None.      DRAINS:  None.  EBL: <100cc      TOURNIQUET TIME:   Total Tourniquet Time Documented: Thigh (Left) - 35 minutes Thigh (Left) - 38 minutes Total: Thigh (Left) - 73 minutes  .      The patient was stable to the recovery room.      INDICATION FOR PROCEDURE:  Whitney Ortiz is a 68 y.o. female patient of   mine.  The patient had been seen, evaluated, and treated conservatively in the   office with medication, activity modification, and injections.  The patient had   radiographic changes of bone-on-bone arthritis with endplate sclerosis and osteophytes noted in both her knees.      The patient failed conservative measures including medication, injections, and activity modification, and at this point was ready for more definitive measures.   Based on the radiographic changes and failed conservative measures, the patient   decided to proceed with total knee replacement.  She wished to proceed  with bilateral procedures as opposed to staged after reviewing risks and benefits of this.  She had also had a friend who had recovered from bilateral total knees very well.  Risks of infection,   DVT, component failure, need for revision surgery, postop course, and   expectations were all   discussed and reviewed.  Consent was obtained for benefit of pain   relief.      PROCEDURE IN DETAIL:  The patient was brought to the operative theater.   Once adequate anesthesia, preoperative antibiotics, 2 gm of Ancef administered, 1 gm of Tranexamic Acid for the first left knee, the patient was positioned supine with the bilateral thigh tourniquets placed.  Then both lower extremities were prepped and draped in sterile fashion.  A time-   out was performed identifying the patient, planned procedures, and  extremities.      The both lower extremities were placed in the [facility] leg holders.  We started on the left knee.  The leg was   exsanguinated, tourniquet elevated to 250 mmHg.  A midline incision was   made followed by median parapatellar arthrotomy.  Following initial   exposure, attention was first directed to the patella.  Precut   measurement was noted to be 25 mm.  I resected down to 15 mm and used a   38 patellar button to restore patellar height as well as cover the cut   surface.      The lug holes were drilled and a metal shim was placed to protect the   patella from retractors and saw blades.      At this point, attention was now directed to the femur.  The femoral   canal was opened with a drill, irrigated to try to prevent fat emboli.  An   intramedullary rod was passed at 5 degrees valgus, 10 mm of bone was   resected off the distal femur.  Following this resection, the tibia was   subluxated anteriorly.  Using the extramedullary guide, 2 mm of bone was resected off   the proximal medial tibia.  We confirmed the gap would be   stable medially and laterally with a 10 mm insert as  well as confirmed   the cut was perpendicular in the coronal plane, checking with an alignment rod.      Once this was done, I sized the femur to be a size 4 in the anterior-   posterior dimension, chose a narrow component based on medial and   lateral dimension.  The size 4 rotation block was then pinned in   position anterior referenced using the C-clamp to set rotation.  The   anterior, posterior, and  chamfer cuts were made without difficulty nor   notching making certain that I was along the anterior cortex to help   with flexion gap stability.      The final box cut was made off the lateral aspect of distal femur.      At this point, the tibia was sized to be a size 4, the size 4 tray was   then pinned in position through the medial third of the tubercle,   drilled, and keel punched.  Trial reduction was now carried with a 4N femur,  4 tibia, a 10 mm insert, and the 38 patella botton.  The knee was brought to   extension, full extension with good flexion stability with the patella   tracking through the trochlea without application of pressure.  Given   all these findings, the trial components removed.  Final components were   opened and cement was mixed.  The knee was irrigated with normal saline   solution and pulse lavage.  The synovial lining was   then injected with 15cc 0.25% Marcaine with epinephrine.      The knee was irrigated.  Final implants were then cemented onto clean and   dried cut surfaces of bone with the knee brought to extension with a 10 mm trial insert.      Once the cement had fully cured, the excess cement was removed   throughout the knee.  I confirmed I was satisfied with the range of   motion and stability, and the final 10 mm PS insert was chosen.  It was   placed into the knee.  The tourniquet had been let down at 35 minutes.  No significant   hemostasis required.  The   extensor mechanism was then reapproximated using #1 Vicryl and #0 V-lock  sutures with the knee   in flexion.  The   remaining wound was closed with 2-0 Vicryl and running 4-0 Monocryl.   The knee was cleaned, dried, dressed sterilely using Dermabond and   Aquacel dressing.  The patient was then   brought to recovery room in stable condition, tolerating the procedure   well.   Please note that Physician Assistant, Danae Orleans, PA-C, was present for the entirety of the case, and was utilized for pre-operative positioning, peri-operative retractor management, general facilitation of the procedure.  He was also utilized for primary wound closure at the end of the case.  Once final closure began on this left knee I attended to the right knee.  I gave another 500mg  of Tranexamic Acid for this right knee.  Details of right knee surgery below       MEDICAL RECORD NO.:  [id number]                               FACILITY:  [facility]      PHYSICIAN:  Pietro Cassis. Alvan Dame, M.D.  DATE OF BIRTH:  1945-04-30      DATE OF PROCEDURE:  11/01/2013                                     OPERATIVE REPORT         PROCEDURE:  Right total knee replacement.      COMPONENTS USED:  DePuy rotating platform posterior stabilized knee   system, a size 3 femur, 3 tibia, 15 mm PS insert, and 38 patellar   button.      SURGEON:  Pietro Cassis. Alvan Dame, M.D.      ASSISTANT:  Danae Orleans, PA-C.      ANESTHESIA:  Epidural.      SPECIMENS:  None.      COMPLICATION:  None.      DRAINS:  One Hemovac.  EBL: <100      TOURNIQUET TIME:   Total Tourniquet Time Documented: Thigh (Left) - 35 minutes Thigh (Left) - 38 minutes Total: Thigh (Left) - 73 minutes  .      The patient was stable to the recovery room.        PROCEDURE IN DETAIL:  The patient was brought to the operative theater.   Once adequate anesthesia, preoperative antibiotics, 2 gm of Ancef administered, the patient was positioned supine with the bilateral thigh tourniquets placed.  The  right lower extremity was prepped and  draped in sterile fashion.  A time-   out was performed identifying the patient, planned procedure, and   extremity.      The right lower extremity was placed in the [facility] leg holder.  The leg was   exsanguinated, tourniquet elevated to 250 mmHg.  A midline incision was   made followed by median parapatellar arthrotomy.  Following initial   exposure, attention was first directed to the patella.  Precut   measurement was noted to be 25 mm.  I resected down to 14-15 mm and used a   38 patellar button to restore patellar height as well as cover the cut   surface.  The lug holes were drilled and a metal shim was placed to protect the   patella from retractors and saw blades.      At this point, attention was now directed to the femur.  The femoral   canal was opened with a drill, irrigated to try to prevent fat emboli.  An   intramedullary rod was passed at 5 degrees valgus, 10 mm of bone was   resected off the distal femur.  Following this resection, the tibia was   subluxated anteriorly.  Using the extramedullary guide, 2-4 mm of bone was resected off   the proximal medial tibia.  We confirmed the gap would be   stable medially and laterally with a 10 mm insert as well as confirmed   the cut was perpendicular in the coronal plane, checking with an alignment rod.      Once this was done, I sized the femur to be a size 3 in the anterior-   posterior dimension, chose a standard component based on medial and   lateral dimension.  The size 3 rotation block was then pinned in   position anterior referenced using the C-clamp to set rotation.  The   anterior, posterior, and  chamfer cuts were made without difficulty nor   notching making certain that I was along the anterior cortex to help   with flexion gap stability.      The final box cut was made off the lateral aspect of distal femur.      At this point, the tibia was sized to be a size 3, the size 3 tray was   then pinned in  position through the medial third of the tubercle,   drilled, and keel punched.  Trial reduction was now carried with a 3 femur,  3 tibia, a 12.5 then 15 mm insert, and the 38 patella botton.  The knee was brought to   extension, full extension with good flexion stability with the patella   tracking through the trochlea without application of pressure.  Given   all these findings, the trial components removed.  Final components were   opened and cement was mixed.  The knee was irrigated with normal saline   solution and pulse lavage.  The synovial lining was   then injected with 0.25% Marcaine with epinephrine and 1 cc of Toradol,   total of 61 cc.      The knee was irrigated.  Final implants were then cemented onto clean and   dried cut surfaces of bone with the knee brought to extension with a 15 mm trial insert.      Once the cement had fully cured, the excess cement was removed   throughout the knee.  I confirmed I was satisfied with the range of   motion and stability, and the final 15 mm PS insert was chosen.  It was   placed into the knee.      The tourniquet had been let down at 38 minutes.  No significant   hemostasis required.  The   extensor mechanism was then reapproximated using #1 Vicryl and #0 V-lock sutures with the knee   in flexion.  The   remaining wound was closed with 2-0 Vicryl and running 4-0 Monocryl.   The knee was cleaned, dried, dressed sterilely using Dermabond and   Aquacel dressing.  The patient was then   brought to recovery room in stable condition, tolerating the procedure   well.   Please  note that Physician Assistant, Danae Orleans, PA-C, was present for the entirety of the case, and was utilized for pre-operative positioning, peri-operative retractor management, general facilitation of the procedure.  He was also utilized for primary wound closure at the end of the case.              Pietro Cassis Alvan Dame, M.D.    11/01/2013 1:33 PM

## 2013-11-01 NOTE — Anesthesia Preprocedure Evaluation (Addendum)
Anesthesia Evaluation  Patient identified by MRN, date of birth, ID band Patient awake    Reviewed: Allergy & Precautions, H&P , NPO status , Patient's Chart, lab work & pertinent test results  Airway Mallampati: II TM Distance: >3 FB Neck ROM: full    Dental  (+) Dental Advisory Given, Caps All front teeth are capped:   Pulmonary neg pulmonary ROS,  breath sounds clear to auscultation  Pulmonary exam normal       Cardiovascular Exercise Tolerance: Good negative cardio ROS  Rhythm:regular Rate:Normal     Neuro/Psych negative neurological ROS  negative psych ROS   GI/Hepatic negative GI ROS, Neg liver ROS,   Endo/Other  negative endocrine ROSHypothyroidism   Renal/GU negative Renal ROS  negative genitourinary   Musculoskeletal   Abdominal   Peds  Hematology negative hematology ROS (+)   Anesthesia Other Findings   Reproductive/Obstetrics negative OB ROS                          Anesthesia Physical Anesthesia Plan  ASA: II  Anesthesia Plan: General   Post-op Pain Management:    Induction:   Airway Management Planned: Oral ETT  Additional Equipment:   Intra-op Plan:   Post-operative Plan: Extubation in OR  Informed Consent: I have reviewed the patients History and Physical, chart, labs and discussed the procedure including the risks, benefits and alternatives for the proposed anesthesia with the patient or authorized representative who has indicated his/her understanding and acceptance.   Dental Advisory Given  Plan Discussed with: CRNA and Surgeon  Anesthesia Plan Comments:         Anesthesia Quick Evaluation

## 2013-11-01 NOTE — Anesthesia Postprocedure Evaluation (Signed)
  Anesthesia Post-op Note  Patient: Whitney Ortiz  Procedure(s) Performed: Procedure(s) (LRB): TOTAL KNEE BILATERAL (Bilateral)  Patient Location: PACU  Anesthesia Type: GA combined with regional for post-op pain  Level of Consciousness: awake and alert   Airway and Oxygen Therapy: Patient Spontanous Breathing  Post-op Pain: mild  Post-op Assessment: Post-op Vital signs reviewed, Patient's Cardiovascular Status Stable, Respiratory Function Stable, Patent Airway and No signs of Nausea or vomiting  Last Vitals:  Filed Vitals:   11/01/13 1445  BP: 139/65  Pulse: 54  Temp:   Resp: 13    Post-op Vital Signs: stable   Complications: No apparent anesthesia complications

## 2013-11-01 NOTE — Interval H&P Note (Signed)
History and Physical Interval Note:  11/01/2013 8:57 AM  Whitney Ortiz  has presented today for surgery, with the diagnosis of BILATERAL KNEE OA  The various methods of treatment have been discussed with the patient and family. After consideration of risks, benefits and other options for treatment, the patient has consented to  Procedure(s): TOTAL KNEE BILATERAL (Bilateral) as a surgical intervention .  The patient's history has been reviewed, patient examined, no change in status, stable for surgery.  I have reviewed the patient's chart and labs.  Questions were answered to the patient's satisfaction.     Mauri Pole

## 2013-11-02 ENCOUNTER — Encounter (HOSPITAL_COMMUNITY): Payer: Self-pay | Admitting: Orthopedic Surgery

## 2013-11-02 LAB — BASIC METABOLIC PANEL
ANION GAP: 9 (ref 5–15)
BUN: 13 mg/dL (ref 6–23)
CALCIUM: 8.7 mg/dL (ref 8.4–10.5)
CO2: 25 meq/L (ref 19–32)
Chloride: 106 mEq/L (ref 96–112)
Creatinine, Ser: 0.88 mg/dL (ref 0.50–1.10)
GFR calc non Af Amer: 66 mL/min — ABNORMAL LOW (ref >90)
GFR, EST AFRICAN AMERICAN: 77 mL/min — AB (ref >90)
Glucose, Bld: 131 mg/dL — ABNORMAL HIGH (ref 70–99)
Potassium: 5 mEq/L (ref 3.7–5.3)
SODIUM: 140 meq/L (ref 137–147)

## 2013-11-02 LAB — CBC
HCT: 36.1 % (ref 36.0–46.0)
Hemoglobin: 11.9 g/dL — ABNORMAL LOW (ref 12.0–15.0)
MCH: 30.3 pg (ref 26.0–34.0)
MCHC: 33 g/dL (ref 30.0–36.0)
MCV: 91.9 fL (ref 78.0–100.0)
PLATELETS: 203 10*3/uL (ref 150–400)
RBC: 3.93 MIL/uL (ref 3.87–5.11)
RDW: 13.4 % (ref 11.5–15.5)
WBC: 13.1 10*3/uL — ABNORMAL HIGH (ref 4.0–10.5)

## 2013-11-02 MED ORDER — RIVAROXABAN 10 MG PO TABS
10.0000 mg | ORAL_TABLET | Freq: Every day | ORAL | Status: DC
  Filled 2013-11-02: qty 1

## 2013-11-02 MED ORDER — RIVAROXABAN 10 MG PO TABS
10.0000 mg | ORAL_TABLET | ORAL | Status: DC
  Administered 2013-11-02: 10 mg via ORAL
  Filled 2013-11-02 (×2): qty 1

## 2013-11-02 NOTE — Progress Notes (Signed)
CARE MANAGEMENT NOTE 11/02/2013  Patient:  Whitney Ortiz, Whitney Ortiz   Account Number:  [id number]  Date Initiated:  11/02/2013  Documentation initiated by:  DAVIS,RHONDA  Subjective/Objective Assessment:   bilateral total knee replacement     Action/Plan:   [facility] post hospital stay   Anticipated DC Date:  11/05/2013   Anticipated DC Plan:  [place]  In-house referral  Clinical Social Worker      DC Planning Services  NA      [facility] Choice  NA   Choice offered to / List presented to:  NA   DME arranged  NA      DME agency  NA     [place] arranged  NA      [place] agency  NA   Status of service:  In process, will continue to follow Medicare Important Message given?  NA - LOS <3 / Initial given by admissions (If response is "NO", the following Medicare IM given date fields will be blank) Date Medicare IM given:   Medicare IM given by:   Date Additional Medicare IM given:   Additional Medicare IM given by:    Discharge Disposition:    Per UR Regulation:  Reviewed for med. necessity/level of care/duration of stay  If discussed at [place] of Stay Meetings, dates discussed:    Comments:  Suanne Marker Davis,RN,BSN,CCM

## 2013-11-02 NOTE — Progress Notes (Signed)
Patient ID: Whitney Ortiz, female   DOB: 15-Jan-1946, 68 y.o.   MRN: [id number] Subjective: 1 Day Post-Op Procedure(s) (LRB): TOTAL KNEE BILATERAL (Bilateral)    Patient reports pain as none with epidural in place, comfortable night  Objective:   VITALS:   Filed Vitals:   11/02/13 0625  BP: 110/53  Pulse: 60  Temp:   Resp: 16    Dorsiflexion/Plantar flexion intact Incision: dressing C/D/I - bilaterally  LABS  Recent Labs  11/02/13 0430  HGB 11.9*  HCT 36.1  WBC 13.1*  PLT 203     Recent Labs  11/02/13 0430  NA 140  K 5.0  BUN 13  CREATININE 0.88  GLUCOSE 131*    No results found for this basename: LABPT, INR,  in the last 72 hours   Assessment/Plan: 1 Day Post-Op Procedure(s) (LRB): TOTAL KNEE BILATERAL (Bilateral)   Advance diet Up with therapy Continue foley due to due to epidural  Will plan to remove epidural today and start oral pain regiment to be able to adjust prior to discharge Plan for [place] at time of discharge.  Start DVT prophylaxis 6-8 hours after D/C foley after 6 hours of epidural out

## 2013-11-02 NOTE — Evaluation (Signed)
Physical Therapy Evaluation Patient Details Name: Whitney Ortiz MRN: [id number] DOB: 01-23-1946 Today's Date: 11/02/2013   History of Present Illness  Pt is a 68 year old female s/p bil TKAs.  Clinical Impression  Pt is s/p bilateral TKAs resulting in the deficits listed below (see PT Problem List).  Pt will benefit from skilled PT to increase their independence and safety with mobility to allow discharge to the venue listed below.  Pt attempted standing this morning however LEs still numb from epidural so deferred further mobility.  Pt performed LE exercises in supine.  Pt plans to d/c to SNF.     Follow Up Recommendations SNF    Equipment Recommendations  None recommended by PT    Recommendations for Other Services       Precautions / Restrictions Precautions Precautions: Knee Restrictions Other Position/Activity Restrictions: WBAT bil      Mobility  Bed Mobility Overal bed mobility: Needs Assistance Bed Mobility: Supine to Sit;Sit to Supine     Supine to sit: +2 for physical assistance;Max assist Sit to supine: Max assist;+2 for physical assistance   General bed mobility comments: assist for upper and lower body, pt with more dificulty moving L LE vs R LE so applied KI to L LE  Transfers Overall transfer level: Needs assistance Equipment used: Rolling walker (2 wheeled) Transfers: Sit to/from Stand Sit to Stand: +2 physical assistance;Total assist         General transfer comment: verbal cues for technique, UE and LE positioning, weight shifting however pt unable to stand fully upright  Ambulation/Gait                Stairs            Wheelchair Mobility    Modified Rankin (Stroke Patients Only)       Balance                                             Pertinent Vitals/Pain      Home Living Family/patient expects to be discharged to:: Skilled nursing facility                      Prior Function Level of  Independence: Independent               Hand Dominance        Extremity/Trunk Assessment               Lower Extremity Assessment: RLE deficits/detail;LLE deficits/detail RLE Deficits / Details: fair quad contraction, knee flexion 50* AAROM, difficulty assessing as pt still with epidural LLE Deficits / Details: fair quad contraction, knee flexion 35* AAROM, difficulty assessing as pt still with epidural     Communication   Communication: No difficulties  Cognition Arousal/Alertness: Awake/alert Behavior During Therapy: WFL for tasks assessed/performed Overall Cognitive Status: Within Functional Limits for tasks assessed                      General Comments      Exercises Total Joint Exercises Ankle Circles/Pumps: AROM;Both;15 reps Quad Sets: AROM;Both;10 reps Short Arc QuadSinclair Ship;Both;10 reps Heel Slides: AAROM;Both;10 reps Hip ABduction/ADduction: AAROM;AROM;Both;10 reps;Other (comment) (assist for L LE)      Assessment/Plan    PT Assessment Patient needs continued PT services  PT Diagnosis Difficulty walking   PT Problem List  Decreased strength;Decreased range of motion;Decreased mobility;Decreased activity tolerance;Decreased knowledge of use of DME  PT Treatment Interventions Functional mobility training;Gait training;DME instruction;Patient/family education;Therapeutic activities;Therapeutic exercise   PT Goals (Current goals can be found in the Care Plan section) Acute Rehab PT Goals PT Goal Formulation: With patient Time For Goal Achievement: 11/12/13 Potential to Achieve Goals: Good    Frequency 7X/week   Barriers to discharge        Co-evaluation               End of Session Equipment Utilized During Treatment: Left knee immobilizer Activity Tolerance: Patient tolerated treatment well Patient left: in bed;with call bell/phone within reach;with nursing/sitter in room           Time: 7026-3785 PT Time Calculation  (min): 32 min   Charges:   PT Evaluation $Initial PT Evaluation Tier I: 1 Procedure PT Treatments $Therapeutic Exercise: 8-22 mins $Therapeutic Activity: 8-22 mins   PT G Codes:          Navdeep Fessenden,KATHrine E 11/02/2013, 1:23 PM Carmelia Bake, PT, DPT 11/02/2013 Pager: [phone]

## 2013-11-02 NOTE — Discharge Instructions (Signed)
Information on my medicine - XARELTO (Rivaroxaban)  This medication education was reviewed with me or my healthcare representative as part of my discharge preparation.  The pharmacist that spoke with me during my hospital stay was:  Meghen Akopyan, Julieta Bellini, RPH  Why was Xarelto prescribed for you? Xarelto was prescribed for you to reduce the risk of blood clots forming after orthopedic surgery. The medical term for these abnormal blood clots is venous thromboembolism (VTE).  What do you need to know about xarelto ? Take your Xarelto ONCE DAILY at the same time every day. You may take it either with or without food.  If you have difficulty swallowing the tablet whole, you may crush it and mix in applesauce just prior to taking your dose.  Take Xarelto exactly as prescribed by your doctor and DO NOT stop taking Xarelto without talking to the doctor who prescribed the medication.  Stopping without other VTE prevention medication to take the place of Xarelto may increase your risk of developing a clot.  After discharge, you should have regular check-up appointments with your healthcare provider that is prescribing your Xarelto.    What do you do if you miss a dose? If you miss a dose, take it as soon as you remember on the same day then continue your regularly scheduled once daily regimen the next day. Do not take two doses of Xarelto on the same day.   Important Safety Information A possible side effect of Xarelto is bleeding. You should call your healthcare provider right away if you experience any of the following:   Bleeding from an injury or your nose that does not stop.   Unusual colored urine (red or dark brown) or unusual colored stools (red or black).   Unusual bruising for unknown reasons.   A serious fall or if you hit your head (even if there is no bleeding).  Some medicines may interact with Xarelto and might increase your risk of bleeding while on Xarelto. To help avoid  this, consult your healthcare provider or pharmacist prior to using any new prescription or non-prescription medications, including herbals, vitamins, non-steroidal anti-inflammatory drugs (NSAIDs) and supplements.  This website has more information on Xarelto: [email or web address].[email or web address]

## 2013-11-02 NOTE — Progress Notes (Signed)
Removed epidural catheter intact.  Patient does not have a headache at this time and says pain relief was good.  Site looks good.   Oryan Winterton MD

## 2013-11-02 NOTE — Progress Notes (Signed)
Physical Therapy Treatment Note   11/02/13 1500  PT Visit Information  Last PT Received On 11/02/13  Assistance Needed +2  History of Present Illness Pt is a 68 year old female s/p bil TKAs.  PT Time Calculation  PT Start Time 1442  PT Stop Time 1458  PT Time Calculation (min) 16 min  Subjective Data  Subjective Pt assisted with ambulation today around room.  Precautions  Precautions Knee  Precaution Comments used Bil KIs today  Restrictions  Other Position/Activity Restrictions WBAT bil  Pain Assessment  Pain Assessment No/denies pain  Cognition  Arousal/Alertness Awake/alert  Behavior During Therapy WFL for tasks assessed/performed  Overall Cognitive Status Within Functional Limits for tasks assessed  Bed Mobility  Overal bed mobility Needs Assistance  Bed Mobility Supine to Sit;Sit to Supine  Supine to sit Mod assist;+2 for physical assistance;+2 for safety/equipment;HOB elevated  Sit to supine Mod assist;+2 for safety/equipment  General bed mobility comments assist for upper and lower body,  due to difficulty mobilizing this morning bil KIs applied  Transfers  Overall transfer level Needs assistance  Equipment used Rolling walker (2 wheeled)  Transfers Sit to/from Stand  Sit to Stand From elevated surface;Min assist;+2 safety/equipment;+2 physical assistance  General transfer comment verbal cues for technique, UE and LE positioning, weight shifting, bed elevated  Ambulation/Gait  Ambulation/Gait assistance Min assist;+2 safety/equipment  Ambulation Distance (Feet) 14 Feet  Assistive device Rolling walker (2 wheeled)  Gait Pattern/deviations Step-to pattern;Antalgic;Wide base of support  Gait velocity decr  General Gait Details verbal cues for RW positioning, sequence, pt with short wide steps (also gait pattern prior to surgery per pt)  PT - End of Session  Equipment Utilized During Treatment Gait belt;Left knee immobilizer;Right knee immobilizer  Activity Tolerance  Patient tolerated treatment well  Patient left with call bell/phone within reach;in bed  Nurse Communication Mobility status  PT - Assessment/Plan  PT Plan Current plan remains appropriate  PT Frequency 7X/week  Follow Up Recommendations SNF  PT equipment None recommended by PT  PT Goal Progression  Progress towards PT goals Progressing toward goals  PT General Charges  $$ ACUTE PT VISIT 1 Procedure  PT Treatments  $Gait Training 8-22 mins   Carmelia Bake, PT, DPT 11/02/2013 Pager: [phone]

## 2013-11-02 NOTE — Addendum Note (Signed)
Addendum created 11/02/13 1413 by Peyton Najjar, MD   Modules edited: Anesthesia LDA, Clinical Notes   Clinical Notes:  File: [id number]

## 2013-11-02 NOTE — Progress Notes (Signed)
Clinical Social Work Department CLINICAL SOCIAL WORK PLACEMENT NOTE 11/02/2013  Patient:  Whitney Ortiz, Whitney Ortiz  Account Number:  [id number] Admit date:  11/01/2013  Clinical Social Worker:  Werner Lean, LCSW  Date/time:  11/02/2013 04:27 AM  Clinical Social Work is seeking post-discharge placement for this patient at the following level of care:   SKILLED NURSING   (*CSW will update this form in Epic as items are completed)     Patient/family provided with [place] Department of Clinical Social Work's list of facilities offering this level of care within the geographic area requested by the patient (or if unable, by the patient's family).  11/02/2013  Patient/family informed of their freedom to choose among providers that offer the needed level of care, that participate in Medicare, Medicaid or managed care program needed by the patient, have an available bed and are willing to accept the patient.    Patient/family informed of [facility]' ownership interest in [facility], as well as of the fact that they are under no obligation to receive care at this facility.  PASARR submitted to EDS on 11/02/2013 PASARR number received on 11/02/2013  FL2 transmitted to all facilities in geographic area requested by pt/family on  11/02/2013 FL2 transmitted to all facilities within larger geographic area on   Patient informed that his/her managed care company has contracts with or will negotiate with  certain facilities, including the following:     Patient/family informed of bed offers received:  11/02/2013 Patient chooses bed at [facility] Physician recommends and patient chooses bed at    Patient to be transferred to  on   Patient to be transferred to facility by  Patient and family notified of transfer on  Name of family member notified:    The following physician request were entered in Epic:   Additional Comments:  Werner Lean LCSW [phone]

## 2013-11-02 NOTE — Progress Notes (Signed)
Clinical Social Work Department BRIEF PSYCHOSOCIAL ASSESSMENT 11/02/2013  Patient:  ADANNA, ZUCKERMAN     Account Number:  [id number]     Admit date:  11/01/2013  Clinical Social Worker:  Lacie Scotts  Date/Time:  11/02/2013 04:17 PM  Referred by:  Physician  Date Referred:  11/02/2013 Referred for  SNF Placement   Other Referral:   Interview type:  Patient Other interview type:    PSYCHOSOCIAL DATA Living Status:  HUSBAND Admitted from facility:   Level of care:   Primary support name:  Shanon Brow Primary support relationship to patient:  SPOUSE Degree of support available:   unclear    CURRENT CONCERNS Current Concerns  Post-Acute Placement   Other Concerns:    SOCIAL WORK ASSESSMENT / PLAN Pt is a 68 yr old female living at home prior to hospitalization. CSW met with pt to assist with d/c planning. This is a planned admission. Pt has made prior arrangements to have ST Rehab at [facility] following hospital d/c. CSW has contacted SNF and d/c plan has been confirmed. CSW will continue to follow to assist with d/c planning.   Assessment/plan status:  Psychosocial Support/Ongoing Assessment of Needs Other assessment/ plan:   Information/referral to community resources:   Insurance coverage for SNF and ambulance transport reviewed.    PATIENT'S/FAMILY'S RESPONSE TO PLAN OF CARE: Pt had a fairly good night. Pain is controlled Pt is motivated to work with therapy. She is looking forward to having rehab at [facility].   Werner Lean LCSW [phone]

## 2013-11-03 DIAGNOSIS — E669 Obesity, unspecified: Secondary | ICD-10-CM | POA: Diagnosis present

## 2013-11-03 DIAGNOSIS — D62 Acute posthemorrhagic anemia: Secondary | ICD-10-CM | POA: Diagnosis not present

## 2013-11-03 LAB — BASIC METABOLIC PANEL
ANION GAP: 9 (ref 5–15)
BUN: 14 mg/dL (ref 6–23)
CHLORIDE: 103 meq/L (ref 96–112)
CO2: 27 mEq/L (ref 19–32)
Calcium: 8.8 mg/dL (ref 8.4–10.5)
Creatinine, Ser: 0.92 mg/dL (ref 0.50–1.10)
GFR calc Af Amer: 73 mL/min — ABNORMAL LOW (ref >90)
GFR calc non Af Amer: 63 mL/min — ABNORMAL LOW (ref >90)
Glucose, Bld: 117 mg/dL — ABNORMAL HIGH (ref 70–99)
Potassium: 4.7 mEq/L (ref 3.7–5.3)
SODIUM: 139 meq/L (ref 137–147)

## 2013-11-03 LAB — CBC
HEMATOCRIT: 32.7 % — AB (ref 36.0–46.0)
Hemoglobin: 11.2 g/dL — ABNORMAL LOW (ref 12.0–15.0)
MCH: 31 pg (ref 26.0–34.0)
MCHC: 34.3 g/dL (ref 30.0–36.0)
MCV: 90.6 fL (ref 78.0–100.0)
Platelets: 209 10*3/uL (ref 150–400)
RBC: 3.61 MIL/uL — ABNORMAL LOW (ref 3.87–5.11)
RDW: 13.5 % (ref 11.5–15.5)
WBC: 12.5 10*3/uL — AB (ref 4.0–10.5)

## 2013-11-03 MED ORDER — OXYCODONE HCL 5 MG PO TABS: 5.0000 mg | ORAL_TABLET | ORAL | Status: DC | PRN

## 2013-11-03 MED ORDER — POLYETHYLENE GLYCOL 3350 17 G PO PACK: 17.0000 g | PACK | Freq: Two times a day (BID) | ORAL | Status: DC

## 2013-11-03 MED ORDER — RIVAROXABAN 10 MG PO TABS: 10.0000 mg | ORAL_TABLET | ORAL | Status: DC

## 2013-11-03 MED ORDER — ASPIRIN EC 325 MG PO TBEC: 325.0000 mg | DELAYED_RELEASE_TABLET | Freq: Two times a day (BID) | ORAL | Status: AC

## 2013-11-03 MED ORDER — FERROUS SULFATE 325 (65 FE) MG PO TABS: 325.0000 mg | ORAL_TABLET | Freq: Three times a day (TID) | ORAL | Status: DC

## 2013-11-03 MED ORDER — DSS 100 MG PO CAPS: 100.0000 mg | ORAL_CAPSULE | Freq: Two times a day (BID) | ORAL | Status: DC

## 2013-11-03 MED ORDER — TIZANIDINE HCL 4 MG PO TABS: 4.0000 mg | ORAL_TABLET | Freq: Four times a day (QID) | ORAL | Status: DC | PRN

## 2013-11-03 NOTE — Progress Notes (Signed)
Clinical Social Work Department CLINICAL SOCIAL WORK PLACEMENT NOTE 11/03/2013  Patient:  Whitney Ortiz, Whitney Ortiz  Account Number:  [id number] Admit date:  11/01/2013  Clinical Social Worker:  Werner Lean, LCSW  Date/time:  11/03/2013 12:27 PM  Clinical Social Work is seeking post-discharge placement for this patient at the following level of care:   SKILLED NURSING   (*CSW will update this form in Epic as items are completed)     Patient/family provided with [place] Department of Clinical Social Work's list of facilities offering this level of care within the geographic area requested by the patient (or if unable, by the patient's family).  11/02/2013  Patient/family informed of their freedom to choose among providers that offer the needed level of care, that participate in Medicare, Medicaid or managed care program needed by the patient, have an available bed and are willing to accept the patient.    Patient/family informed of [facility]' ownership interest in [facility], as well as of the fact that they are under no obligation to receive care at this facility.  PASARR submitted to EDS on 11/02/2013 PASARR number received on 11/02/2013  FL2 transmitted to all facilities in geographic area requested by pt/family on  11/02/2013 FL2 transmitted to all facilities within larger geographic area on   Patient informed that his/her managed care company has contracts with or will negotiate with  certain facilities, including the following:     Patient/family informed of bed offers received:  11/02/2013 Patient chooses bed at [place] Physician recommends and patient chooses bed at    Patient to be transferred to [place] on  11/03/2013 Patient to be transferred to facility by [facility] Patient and family notified of transfer on 11/03/2013 Name of family member notified:  Pt declined csw assistance.  The following physician request were entered in Epic:   Additional  Comments: Pt is in agreement with d/c to SNF today via [facility] transport. Nsg has reviewed d/c summary, scripts, avs. Scripts are included in packet.  Werner Lean LCSW [phone]

## 2013-11-03 NOTE — Progress Notes (Signed)
     Subjective: 2 Days Post-Op Procedure(s) (LRB): TOTAL KNEE BILATERAL (Bilateral)   Patient reports pain as mild, pain well controlled. No events throughout the night. Ready to be discharged to skilled nursing facility if her pain stays controled and does well with PT.  Objective:   VITALS:   Filed Vitals:   11/03/13 0645  BP: 133/60  Pulse: 54  Temp: 97.8 F (36.6 C)  Resp: 16    Dorsiflexion/Plantar flexion intact Incision: dressing C/D/I No cellulitis present Compartment soft  LABS  Recent Labs  11/02/13 0430 11/03/13 0442  HGB 11.9* 11.2*  HCT 36.1 32.7*  WBC 13.1* 12.5*  PLT 203 209     Recent Labs  11/02/13 0430 11/03/13 0442  NA 140 139  K 5.0 4.7  BUN 13 14  CREATININE 0.88 0.92  GLUCOSE 131* 117*     Assessment/Plan: 2 Days Post-Op Procedure(s) (LRB): TOTAL KNEE BILATERAL (Bilateral) Up with therapy Discharge to SNF Follow up in 2 weeks at [facility]. Follow up with OLIN,[place] Krotz D in 2 weeks.  Contact information:  [facility] [address], [place] [phone]    Expected ABLA  Treated with iron and will observe  Obese (BMI 30-39.9) Estimated body mass index is 36.5 kg/(m^2) as calculated from the following:   Height as of this encounter: 5\' 8"  (1.727 m).   Weight as of this encounter: 108.863 kg (240 lb). Patient also counseled that weight may inhibit the healing process Patient counseled that losing weight will help with future health issues        West Pugh. Jedd Schulenburg   PAC  11/03/2013, 9:29 AM

## 2013-11-03 NOTE — Discharge Summary (Signed)
Physician Discharge Summary  Patient ID: Whitney Ortiz MRN: [id number] DOB/AGE: [birth year] 68 y.o.  Admit date: 11/01/2013 Discharge date:  11/03/2013  Procedures:  Procedure(s) (LRB): TOTAL KNEE BILATERAL (Bilateral)  Attending Physician:  Dr. Paralee Cancel   Admission Diagnoses:   Bilateral knee OA / pain  Discharge Diagnoses:  Principal Problem:   S/P bilateral TKAs Active Problems:   Obese   Postoperative anemia due to acute blood loss  Past Medical History  Diagnosis Date  . Hypothyroidism   . Arthritis     OA AND PAIN IN BOTH KNEES AND "[place]"  . H/O seasonal allergies     THAT TURN INTO SINUS INFECTIONS    HPI: Whitney Ortiz, 68 y.o. female, has a history of pain and functional disability in bilateral knees due to arthritis and has failed non-surgical conservative treatments for greater than 12 weeks to include NSAID's and/or analgesics, corticosteriod injections, use of assistive devices and activity modification. Onset of symptoms was gradual, starting 5+ years ago with gradually worsening course since that time. The patient noted no past surgery on the bilaterally knee(s). Patient currently rates pain in the bilaterally knee(s) at 8 out of 10 with activity. Patient has night pain, worsening of pain with activity and weight bearing, pain that interferes with activities of daily living, pain with passive range of motion, crepitus and joint swelling. Patient has evidence of periarticular osteophytes and joint space narrowing by imaging studies. There is no active infection. Risks, benefits and expectations were discussed with the patient. Risks including but not limited to the risk of anesthesia, blood clots, nerve damage, blood vessel damage, failure of the prosthesis, infection and up to and including death. Patient understand the risks, benefits and expectations and wishes to proceed with surgery.  PCP: Patricia Nettle, MD   Discharged Condition: good  Hospital  Course:  Patient underwent the above stated procedure on 11/01/2013. Patient tolerated the procedure well and brought to the recovery room in good condition and subsequently to the floor.  POD #1 BP: 110/53 ; Pulse: 60 ; Resp: 16  Patient reports pain as none with epidural in place, comfortable night. Dorsiflexion/plantar flexion intact, incision: dressing C/D/I, no cellulitis present and compartment soft.   LABS  Basename    HGB  11.9  HCT  36.1   POD #2  BP: 133/60 ; Pulse: 54 ; Temp: 97.8 F (36.6 C) ; Resp: 16  Patient reports pain as mild, pain well controlled. No events throughout the night. Ready to be discharged to skilled nursing facility.  Dorsiflexion/plantar flexion intact, incision: dressing C/D/I, no cellulitis present and compartment soft.   LABS  Basename    HGB  11.2  HCT  32.7    Discharge Exam: General appearance: alert, cooperative and no distress Extremities: Homans sign is negative, no sign of DVT, no edema, redness or tenderness in the calves or thighs and no ulcers, gangrene or trophic changes  Disposition:    Skilled nursing facility with follow up in 2 weeks   Follow-up Information   Follow up with Mauri Pole, MD. Schedule an appointment as soon as possible for a visit in 2 weeks.   Specialty:  Orthopedic Surgery   Contact information:   [address] [place] [address] [phone]       Discharge Instructions   Call MD / Call 911    Complete by:  As directed   If you experience chest pain or shortness of breath, CALL  911 and be transported to the hospital emergency room.  If you develope a fever above 101 F, pus (white drainage) or increased drainage or redness at the wound, or calf pain, call your surgeon's office.     Change dressing    Complete by:  As directed   Maintain surgical dressing for 10-14 days, or until follow up in the clinic.     Constipation Prevention    Complete by:  As directed   Drink plenty of  fluids.  Prune juice may be helpful.  You may use a stool softener, such as Colace (over the counter) 100 mg twice a day.  Use MiraLax (over the counter) for constipation as needed.     Diet - low sodium heart healthy    Complete by:  As directed      Discharge instructions    Complete by:  As directed   Maintain surgical dressing for 10-14 days, or until follow up in the clinic. Follow up in 2 weeks at [facility]. Call with any questions or concerns.     Driving restrictions    Complete by:  As directed   No driving for 4 weeks     Increase activity slowly as tolerated    Complete by:  As directed      TED hose    Complete by:  As directed   Use stockings (TED hose) for 2 weeks on both leg(s).  You may remove them at night for sleeping.     Weight bearing as tolerated    Complete by:  As directed   Laterality:  bilateral  Extremity:  Lower               Medication List    STOP taking these medications       piroxicam 20 MG capsule  Commonly known as:  FELDENE      TAKE these medications       aspirin EC 325 MG tablet  Take 1 tablet (325 mg total) by mouth 2 (two) times daily. Take for 4 weeks.     DSS 100 MG Caps  Take 100 mg by mouth 2 (two) times daily.     ferrous sulfate 325 (65 FE) MG tablet  Take 1 tablet (325 mg total) by mouth 3 (three) times daily after meals.     oxyCODONE 5 MG immediate release tablet  Commonly known as:  Oxy IR/ROXICODONE  Take 1-3 tablets (5-15 mg total) by mouth every 4 (four) hours as needed for severe pain.     polyethylene glycol packet  Commonly known as:  MIRALAX / GLYCOLAX  Take 17 g by mouth 2 (two) times daily.     rivaroxaban 10 MG Tabs tablet  Commonly known as:  XARELTO  Take 1 tablet (10 mg total) by mouth daily.     thyroid 60 MG tablet  Commonly known as:  ARMOUR  Take 60 mg by mouth daily before breakfast.     tiZANidine 4 MG tablet  Commonly known as:  ZANAFLEX  Take 1 tablet (4 mg total) by  mouth every 6 (six) hours as needed.         Signed: [place] Pugh. Amare Kontos   PA-C  11/03/2013, 9:38 AM

## 2013-11-03 NOTE — Plan of Care (Signed)
Problem: Phase III Progression Outcomes Goal: Anticoagulant follow-up in place Outcome: Not Applicable Date Met:  67/51/98 Xarelto VTE, no f/u needed.

## 2013-11-03 NOTE — Progress Notes (Signed)
Physical Therapy Treatment Patient Details Name: Whitney Ortiz MRN: [id number] DOB: [birth year] Today's Date: 2013/11/20    History of Present Illness Pt is a 68 year old female s/p bil TKAs.    PT Comments    Pt mobilizing very well POD #2 and reports very little pain (0 at rest and 2/10 with mobility).    Follow Up Recommendations  SNF     Equipment Recommendations  None recommended by PT    Recommendations for Other Services       Precautions / Restrictions Precautions Precautions: Knee Precaution Comments: used Bil KIs today Restrictions Other Position/Activity Restrictions: WBAT bil    Mobility  Bed Mobility Overal bed mobility: Needs Assistance Bed Mobility: Supine to Sit     Supine to sit: +2 for physical assistance;+2 for safety/equipment;HOB elevated;Min assist     General bed mobility comments: verbal cues for technique, increased time but pt able to assist more today  Transfers Overall transfer level: Needs assistance Equipment used: Rolling walker (2 wheeled) Transfers: Sit to/from Stand Sit to Stand: From elevated surface;Min assist;+2 safety/equipment         General transfer comment: verbal cues for technique, UE and LE positioning, weight shifting, bed elevated  Ambulation/Gait Ambulation/Gait assistance: Min guard;+2 safety/equipment Ambulation Distance (Feet): 60 Feet Assistive device: Rolling walker (2 wheeled) Gait Pattern/deviations: Step-to pattern;Antalgic Gait velocity: decr   General Gait Details: verbal cues for RW positioning, sequence, pt with short steps however improved BOS today   Stairs            Wheelchair Mobility    Modified Rankin (Stroke Patients Only)       Balance                                    Cognition Arousal/Alertness: Awake/alert Behavior During Therapy: WFL for tasks assessed/performed Overall Cognitive Status: Within Functional Limits for tasks assessed                       Exercises Total Joint Exercises Ankle Circles/Pumps: AROM;Both;15 reps Quad Sets: AROM;Both;10 reps Towel Squeeze: AROM;Both;10 reps Short Arc QuadSinclair Ship;Both;10 reps Heel Slides: AAROM;Both;10 reps    General Comments        Pertinent Vitals/Pain Pain Assessment: No/denies pain Pain Intervention(s): Monitored during session;Ice applied;Repositioned    Home Living                      Prior Function            PT Goals (current goals can now be found in the care plan section) Progress towards PT goals: Progressing toward goals    Frequency  7X/week    PT Plan Current plan remains appropriate    Co-evaluation             End of Session Equipment Utilized During Treatment: Gait belt;Left knee immobilizer;Right knee immobilizer Activity Tolerance: Patient tolerated treatment well Patient left: with call bell/phone within reach;in chair     Time: 0955-1020 PT Time Calculation (min): 25 min  Charges:  $Gait Training: 8-22 mins $Therapeutic Exercise: 8-22 mins                    G Codes:      Hilberto Burzynski,KATHrine E November 20, 2013, 11:57 AM Carmelia Bake, PT, DPT 2013/11/20 Pager: [phone]

## 2013-11-03 NOTE — Progress Notes (Signed)
OT Cancellation Note  Patient Details Name: Whitney Ortiz MRN: [id number] DOB: August 27, 1945   Cancelled Treatment:    Reason Eval/Treat Not Completed: Other (comment)  Pt is Medicare/Medicaid and current D/C plan is SNF. No apparent immediate acute care OT needs, therefore will defer OT to SNF. If OT eval is needed please call Acute Rehab Dept. at [place] 11/03/2013, 7:28 AM Lesle Chris, OTR/L [phone] 11/03/2013

## 2013-11-04 ENCOUNTER — Non-Acute Institutional Stay (SKILLED_NURSING_FACILITY): Payer: Medicare Other | Admitting: Internal Medicine

## 2013-11-04 DIAGNOSIS — IMO0002 Reserved for concepts with insufficient information to code with codable children: Principal | ICD-10-CM

## 2013-11-04 DIAGNOSIS — M1710 Unilateral primary osteoarthritis, unspecified knee: Secondary | ICD-10-CM

## 2013-11-04 DIAGNOSIS — K5900 Constipation, unspecified: Secondary | ICD-10-CM

## 2013-11-04 DIAGNOSIS — E039 Hypothyroidism, unspecified: Secondary | ICD-10-CM

## 2013-11-06 DIAGNOSIS — K5900 Constipation, unspecified: Secondary | ICD-10-CM | POA: Insufficient documentation

## 2013-11-06 DIAGNOSIS — M1710 Unilateral primary osteoarthritis, unspecified knee: Secondary | ICD-10-CM | POA: Insufficient documentation

## 2013-11-06 DIAGNOSIS — IMO0002 Reserved for concepts with insufficient information to code with codable children: Principal | ICD-10-CM

## 2013-11-06 DIAGNOSIS — E039 Hypothyroidism, unspecified: Secondary | ICD-10-CM | POA: Insufficient documentation

## 2013-11-06 NOTE — Progress Notes (Signed)
HISTORY & PHYSICAL  DATE: 11/04/2013   FACILITY: [facility]  LEVEL OF CARE: SNF (31)  ALLERGIES:  Allergies  Allergen Reactions  . Penicillins Rash    CHIEF COMPLAINT:  Manage bilateral knee OA, acute blood loss anemia & hypothyroidism  HISTORY OF PRESENT ILLNESS:  Whitney Ortiz is a 68 y/o Caucasian female.  KNEE OSTEOARTHRITIS: Patient had a history of pain and functional disability in the bilateral knees due to end-stage osteoarthritis and has failed nonsurgical conservative treatments. Patient had worsening of pain with activity and weight bearing, pain that interfered with activities of daily living & pain with passive range of motion. Therefore patient underwent bilateral total knee arthroplasty and tolerated the procedure well. Patient is admitted to this facility for sort short-term rehabilitation. Patient denies knee pain.  ANEMIA: The anemia has been stable. The patient denies fatigue, melena or hematochezia. No complications from the medications currently being used.  Postoperatively Whitney Ortiz had acute blood loss, but did not require transfusion.  Last hemoglobins are 11.9 & 11.2.  HYPOTHYROIDISM: The hypothyroidism remains stable. No complications noted from the medications presently being used.  The patient denies fatigue or constipation.  Last TSH not available.  PAST MEDICAL HISTORY :  Past Medical History  Diagnosis Date  . Hypothyroidism   . Arthritis     OA AND PAIN IN BOTH KNEES AND "[place]"  . H/O seasonal allergies     THAT TURN INTO SINUS INFECTIONS    PAST SURGICAL HISTORY: Past Surgical History  Procedure Laterality Date  . Abdominal hysterectomy    . Eye surgery      BILATERAL CATARACT EXTRACTIONS WITH LENS IMPLANTS  . Nasal sinus surgery  2015  . Total knee arthroplasty Bilateral 11/01/2013    Procedure: TOTAL KNEE BILATERAL;  Surgeon: Mauri Pole, MD;  Location: [facility];  Service: Orthopedics;  Laterality: Bilateral;     SOCIAL HISTORY:  reports that she has never smoked. She has never used smokeless tobacco. She reports that she drinks alcohol. She reports that she does not use illicit drugs.  FAMILY HISTORY:  Family History  Problem Relation Age of Onset  . Diabetes Mother   . Hypertension Father     CURRENT MEDICATIONS: Reviewed per MAR/see medication list  REVIEW OF SYSTEMS:  See HPI otherwise 14 point ROS is negative.  PHYSICAL EXAMINATION  VS:  See VS section  GENERAL: no acute distress, morbidly obese body habitus EYES: conjunctivae normal, sclerae normal, normal eye lids MOUTH/THROAT: lips without lesions,no lesions in the mouth,tongue is without lesions,uvula elevates in midline NECK: supple, trachea midline, no neck masses, no thyroid tenderness, no thyromegaly LYMPHATICS: no LAN in the neck, no supraclavicular LAN RESPIRATORY: breathing is even & unlabored, BS CTAB CARDIAC: RRR, no murmur,no extra heart sounds, +1 BLE edema GI:  ABDOMEN: abdomen soft, normal BS, no masses, no tenderness  LIVER/SPLEEN: no hepatomegaly, no splenomegaly MUSCULOSKELETAL: HEAD: normal to inspection  EXTREMITIES: LEFT UPPER EXTREMITY: full range of motion, normal strength & tone RIGHT UPPER EXTREMITY:  full range of motion, normal strength & tone LEFT LOWER EXTREMITY:   range of motion not tested due to surgery, normal strength & tone RIGHT LOWER EXTREMITY: range of motion not tested due to surgery, normal strength & tone PSYCHIATRIC: the patient is alert & oriented to person, affect & behavior appropriate  LABS/RADIOLOGY:  Labs reviewed: Basic Metabolic Panel:  Recent Labs  10/22/13 0940 11/02/13 0430 11/03/13 0442  NA 141  140 139  K 3.9 5.0 4.7  CL 104 106 103  CO2 23 25 27   GLUCOSE 88 131* 117*  BUN 19 13 14   CREATININE 0.98 0.88 0.92  CALCIUM 9.5 8.7 8.8   CBC:  Recent Labs  10/22/13 0940 11/02/13 0430 11/03/13 0442  WBC 7.7 13.1* 12.5*  HGB 14.5 11.9* 11.2*  HCT 42.9  36.1 32.7*  MCV 89.7 91.9 90.6  PLT 231 203 209    ASSESSMENT/PLAN:  Bilateral knee OA-s/p total knee arthroplasty.  Continue rehabilitation. Acute blood loss anemia-Recheck Hb.  Cont. Fe. Hypothyroidism-cont. Armour. Constipation-well controlled. Check cbc  I have reviewed patient's medical records received at admission/from hospitalization.  CPT CODE: 54270  Gayani Y Dasanayaka, [place] [phone]

## 2013-11-13 ENCOUNTER — Non-Acute Institutional Stay (SKILLED_NURSING_FACILITY): Payer: Medicare Other | Admitting: Adult Health

## 2013-11-13 ENCOUNTER — Encounter: Payer: Self-pay | Admitting: Adult Health

## 2013-11-13 DIAGNOSIS — D62 Acute posthemorrhagic anemia: Secondary | ICD-10-CM

## 2013-11-13 DIAGNOSIS — Z96653 Presence of artificial knee joint, bilateral: Secondary | ICD-10-CM

## 2013-11-13 DIAGNOSIS — K5900 Constipation, unspecified: Secondary | ICD-10-CM

## 2013-11-13 DIAGNOSIS — E039 Hypothyroidism, unspecified: Secondary | ICD-10-CM

## 2013-11-13 DIAGNOSIS — Z96659 Presence of unspecified artificial knee joint: Secondary | ICD-10-CM

## 2013-11-13 NOTE — Progress Notes (Signed)
Patient ID: Whitney Ortiz, female   DOB: [birth year], 68 y.o.   MRN: [id number]              PROGRESS NOTE  DATE: 11/13/2013   FACILITY: [facility]  LEVEL OF CARE: SNF (31)  Acute Visit  CHIEF COMPLAINT:  Discharge Notes  HISTORY OF PRESENT ILLNESS: This is a 68 year old female who is for discharge home with Home health PT. She has been admitted to [facility] on 11/03/13 from [facility] S/P Bilateral Total Knee arthroplasty.. Patient was admitted to this facility for short-term rehabilitation after the patient's recent hospitalization.  Patient has completed SNF rehabilitation and therapy has cleared the patient for discharge.  Reassessment of ongoing problem(s):  HYPOTHYROIDISM: The hypothyroidism remains stable. No complications noted from the medications presently being used.  The patient denies fatigue or constipation.  Last TSH not available  ANEMIA: The anemia has been stable. The patient denies fatigue, melena or hematochezia. No complications from the medications currently being used. 8/15 hgb 11.2  CONSTIPATION: The constipation remains stable. No complications from the medications presently being used. Patient denies ongoing constipation, abdominal pain, nausea or vomiting.  PAST MEDICAL HISTORY : Reviewed.  No changes/see problem list  CURRENT MEDICATIONS: Reviewed per MAR/see medication list  REVIEW OF SYSTEMS:  GENERAL: no change in appetite, no fatigue, no weight changes, no fever, chills or weakness RESPIRATORY: no cough, SOB, DOE, wheezing, hemoptysis CARDIAC: no chest pain, or palpitations, + edema GI: no abdominal pain, diarrhea, constipation, heart burn, nausea or vomiting  PHYSICAL EXAMINATION  GENERAL: no acute distress, obese EYES: conjunctivae normal, sclerae normal, normal eye lids NECK: supple, trachea midline, no neck masses, no thyroid tenderness, no thyromegaly LYMPHATICS: no LAN in the neck, no supraclavicular  LAN RESPIRATORY: breathing is even & unlabored, BS CTAB CARDIAC: RRR, no murmur,no extra heart sounds, BLE edema 1+ GI: abdomen soft, normal BS, no masses, no tenderness, no hepatomegaly, no splenomegaly EXTREMITIES:  Able to move all 4 extremities PSYCHIATRIC: the patient is alert & oriented to person, affect & behavior appropriate  LABS/RADIOLOGY: Labs reviewed: Basic Metabolic Panel:  Recent Labs  10/22/13 0940 11/02/13 0430 11/03/13 0442  NA 141 140 139  K 3.9 5.0 4.7  CL 104 106 103  CO2 23 25 27   GLUCOSE 88 131* 117*  BUN 19 13 14   CREATININE 0.98 0.88 0.92  CALCIUM 9.5 8.7 8.8   CBC:  Recent Labs  10/22/13 0940 11/02/13 0430 11/03/13 0442  WBC 7.7 13.1* 12.5*  HGB 14.5 11.9* 11.2*  HCT 42.9 36.1 32.7*  MCV 89.7 91.9 90.6  PLT 231 203 209    ASSESSMENT/PLAN:  S/P Bilateral TKA - for Home health PT Hypothyroidism - continue Thyroid Armour Anemia, acute blood loss - continue FeSO4 Constipation - continue Colace and Miralax   I have filled out patient's discharge paperwork and written prescriptions.  Patient will receive home health PT.  Total discharge time: Less than 30 minutes  Discharge time involved coordination of the discharge process with Education officer, museum, nursing staff and therapy department. Medical justification for home health services verified.  CPT CODE: 83382  Seth Bake - NP [facility] [phone]

## 2013-11-17 DIAGNOSIS — E039 Hypothyroidism, unspecified: Secondary | ICD-10-CM

## 2013-11-17 DIAGNOSIS — Z96659 Presence of unspecified artificial knee joint: Secondary | ICD-10-CM

## 2013-11-17 DIAGNOSIS — M159 Polyosteoarthritis, unspecified: Secondary | ICD-10-CM

## 2013-11-17 DIAGNOSIS — Z471 Aftercare following joint replacement surgery: Secondary | ICD-10-CM

## 2013-12-04 DIAGNOSIS — G4730 Sleep apnea, unspecified: Secondary | ICD-10-CM | POA: Insufficient documentation

## 2013-12-04 DIAGNOSIS — F329 Major depressive disorder, single episode, unspecified: Secondary | ICD-10-CM | POA: Insufficient documentation

## 2013-12-04 DIAGNOSIS — M170 Bilateral primary osteoarthritis of knee: Secondary | ICD-10-CM | POA: Insufficient documentation

## 2013-12-04 DIAGNOSIS — J329 Chronic sinusitis, unspecified: Secondary | ICD-10-CM | POA: Insufficient documentation

## 2013-12-04 DIAGNOSIS — M159 Polyosteoarthritis, unspecified: Secondary | ICD-10-CM | POA: Insufficient documentation

## 2013-12-04 DIAGNOSIS — I10 Essential (primary) hypertension: Secondary | ICD-10-CM | POA: Insufficient documentation

## 2013-12-04 DIAGNOSIS — D649 Anemia, unspecified: Secondary | ICD-10-CM | POA: Insufficient documentation

## 2013-12-04 DIAGNOSIS — D126 Benign neoplasm of colon, unspecified: Secondary | ICD-10-CM | POA: Insufficient documentation

## 2013-12-04 DIAGNOSIS — F32A Depression, unspecified: Secondary | ICD-10-CM | POA: Insufficient documentation

## 2013-12-04 DIAGNOSIS — J40 Bronchitis, not specified as acute or chronic: Secondary | ICD-10-CM

## 2013-12-04 DIAGNOSIS — M8580 Other specified disorders of bone density and structure, unspecified site: Secondary | ICD-10-CM | POA: Insufficient documentation

## 2013-12-13 ENCOUNTER — Other Ambulatory Visit: Payer: Self-pay | Admitting: Adult Health

## 2014-08-25 DIAGNOSIS — J209 Acute bronchitis, unspecified: Secondary | ICD-10-CM | POA: Insufficient documentation

## 2014-09-05 ENCOUNTER — Other Ambulatory Visit: Payer: Self-pay

## 2015-02-10 DIAGNOSIS — E782 Mixed hyperlipidemia: Secondary | ICD-10-CM | POA: Insufficient documentation

## 2015-02-10 DIAGNOSIS — I480 Paroxysmal atrial fibrillation: Secondary | ICD-10-CM | POA: Insufficient documentation

## 2015-02-14 DIAGNOSIS — K2960 Other gastritis without bleeding: Secondary | ICD-10-CM | POA: Insufficient documentation

## 2015-02-14 DIAGNOSIS — K289 Gastrojejunal ulcer, unspecified as acute or chronic, without hemorrhage or perforation: Secondary | ICD-10-CM

## 2015-02-14 DIAGNOSIS — B9681 Helicobacter pylori [H. pylori] as the cause of diseases classified elsewhere: Secondary | ICD-10-CM | POA: Insufficient documentation

## 2015-03-21 ENCOUNTER — Encounter: Payer: Self-pay | Admitting: Cardiovascular Disease

## 2015-03-21 ENCOUNTER — Ambulatory Visit (INDEPENDENT_AMBULATORY_CARE_PROVIDER_SITE_OTHER): Payer: Medicare Other | Admitting: Cardiovascular Disease

## 2015-03-21 VITALS — BP 158/98 | HR 69 | Ht 68.0 in | Wt 291.8 lb

## 2015-03-21 DIAGNOSIS — I119 Hypertensive heart disease without heart failure: Secondary | ICD-10-CM | POA: Diagnosis not present

## 2015-03-21 DIAGNOSIS — I480 Paroxysmal atrial fibrillation: Secondary | ICD-10-CM

## 2015-03-21 NOTE — Progress Notes (Signed)
Cardiology Office Note   Date:  03/21/2015   ID:  Whitney, Ortiz 12-11-1945, MRN [id number]  PCP:  Maylon Peppers, MD  Cardiologist:   Thayer Headings, MD   Chief Complaint  Patient presents with  . New Evaluation  . Atrial Fibrillation   Problem List 1. Paroxysmal atrial fib 2. Hypothyroidism 3. Possible HTN    History of Present Illness: Whitney Ortiz is a 70 y.o. female who presents for paroxysmal atrial fib.     Ive reviewed recods from [facility] physicians   [name] had a colonoscopy and was found to have a brief run of atrial tachycardia vs. atrail fib.   She has occasional episodes of palpitations over the years.    Last perhaps only a minute. Does cause some shortness of breath.   No dizziness.   No CP .  Does not get any regular exercise  . Does not have palpitations with exercise.     Past Medical History  Diagnosis Date  . Hypothyroidism   . 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 H/O seasonal allergies     THAT TURN INTO SINUS INFECTIONS    Past Surgical History  Procedure Laterality Date  . Abdominal hysterectomy    . Eye surgery      BILATERAL CATARACT EXTRACTIONS WITH LENS IMPLANTS  . Nasal sinus surgery  2015  . Total knee arthroplasty Bilateral 11/01/2013    Procedure: TOTAL KNEE BILATERAL;  Surgeon: Mauri Pole, MD;  Location: [facility];  Service: Orthopedics;  Laterality: Bilateral;     Current Outpatient Prescriptions  Medication Sig Dispense Refill  . docusate sodium 100 MG CAPS Take 100 mg by mouth 2 (two) times daily. 10 capsule 0  . mometasone-formoterol (DULERA) 200-5 MCG/ACT AERO Inhale 2 puffs into the lungs 2 (two) times daily.    Marland Kitchen omeprazole (PRILOSEC) 40 MG capsule Take 40 mg by mouth daily.  0  . piroxicam (FELDENE) 20 MG capsule Take 20 mg by mouth daily.    Marland Kitchen thyroid (ARMOUR) 60 MG tablet Take 60 mg by mouth daily before breakfast.     No current facility-administered medications for this  visit.    Allergies:   Penicillins    Social History:  The patient  reports that she has never smoked. She has never used smokeless tobacco. She reports that she drinks alcohol. She reports that she does not use illicit drugs.   Family History:  The patient's family history includes Diabetes in her mother; Hypertension in her father.    ROS:  Please see the history of present illness.    Review of Systems: Constitutional:  denies fever, chills, diaphoresis, appetite change and fatigue.  HEENT: denies photophobia, eye pain, redness, hearing loss, ear pain, congestion, sore throat, rhinorrhea, sneezing, neck pain, neck stiffness and tinnitus.  Respiratory: denies SOB, DOE, cough, chest tightness, and wheezing.  Cardiovascular: denies chest pain, palpitations and leg swelling.  Gastrointestinal: denies nausea, vomiting, abdominal pain, diarrhea, constipation, blood in stool.  Genitourinary: denies dysuria, urgency, frequency, hematuria, flank pain and difficulty urinating.  Musculoskeletal: denies  myalgias, back pain, joint swelling, arthralgias and gait problem.   Skin: denies pallor, rash and wound.  Neurological: denies dizziness, seizures, syncope, weakness, light-headedness, numbness and headaches.   Hematological: denies adenopathy, easy bruising, personal or family bleeding history.  Psychiatric/ Behavioral: denies suicidal ideation, mood changes, confusion, nervousness, sleep disturbance and agitation.  All other systems are reviewed and negative.    PHYSICAL EXAM: VS:  BP 158/98 mmHg  Pulse 69  Ht 5\' 8"  (1.727 m)  Wt 291 lb 12.8 oz (132.36 kg)  BMI 44.38 kg/m2  SpO2 96% , BMI Body mass index is 44.38 kg/(m^2). GEN: Well nourished, well developed, in no acute distress HEENT: normal Neck: no JVD, carotid bruits, or masses Cardiac: RRR; no murmurs, rubs, or gallops,no edema  Respiratory:  clear to auscultation bilaterally, normal work of breathing GI: soft,  nontender, nondistended, + BS MS: no deformity or atrophy Skin: warm and dry, no rash Neuro:  Strength and sensation are intact Psych: normal   EKG:  EKG is not ordered today. The ekg ordered 02/10/15  demonstrates a brief 12 beat run of atrial fib vs. Atrial tachycardia     Recent Labs: No results found for requested labs within last 365 days.    Lipid Panel No results found for: CHOL, TRIG, HDL, CHOLHDL, VLDL, LDLCALC, LDLDIRECT    Wt Readings from Last 3 Encounters:  03/21/15 291 lb 12.8 oz (132.36 kg)  11/13/13 251 lb (113.853 kg)  11/01/13 240 lb (108.863 kg)      Other studies Reviewed: Additional studies/ records that were reviewed today include: . Review of the above records demonstrates:    ASSESSMENT AND PLAN:  1.  Paroxysmal atrial fib- Maicy  had a 12 beat run of   paroxysmal atrial fibrillation versus atrial tachycardia. We were able to see normal sinus rhythm on each side of this 12 beat run. This occurred while she was having a colonoscopy. She does have occasional palpitations and it's possible that she may be having paroxysmal atrial fibrillation on a regular basis. I would like to get an echocardiogram for further evaluation of her LV function and valvular function. We will also get a 30 day event monitor. Her CHADS2VASC score is 17 (female, age > 19, HTN)   2. Hypothyroidism - managed by her primary medical doctor  3. Essential HTN:   BP has been high recently .   She still eats a fair amount of salt and salty foods. I've encouraged her to avoid eating salty foods. I have encouraged her to exercise on a regular basis. I've encouraged her to lose weight-all of which will help with her blood pressure control. I will see her in 3 months. We will need to consider starting an antihypertensive if her blood pressure is still elevated.  4.  Obesity:   Have encouraged her to work on a better diet, exercise and weight loss program   Current medicines are reviewed at  length with the patient today.  The patient does not have concerns regarding medicines.  The following changes have been made:  no change  Labs/ tests ordered today include:   Orders Placed This Encounter  Procedures  . Cardiac event monitor  . Echocardiogram     Disposition:   FU with me in 3 months .      Rodrigus Kilker, Wonda Cheng, MD  03/21/2015 3:57 PM    [address] [place], [place], [place]  [address] Phone: [phone]; Fax: [phone]   [facility]  [address] [place], [place]  [address] [phone]   Fax [phone]

## 2015-03-21 NOTE — Patient Instructions (Signed)
Medication Instructions:  Your physician recommends that you continue on your current medications as directed. Please refer to the Current Medication list given to you today.   Labwork: None Ordered   Testing/Procedures: Your physician has requested that you have an echocardiogram. Echocardiography is a painless test that uses sound waves to create images of your heart. It provides your doctor with information about the size and shape of your heart and how well your heart's chambers and valves are working. This procedure takes approximately one hour. There are no restrictions for this procedure.  Your physician has recommended that you wear an event monitor. Event monitors are medical devices that record the heart's electrical activity. Doctors most often [place] these monitors to diagnose arrhythmias. Arrhythmias are problems with the speed or rhythm of the heartbeat. The monitor is a small, portable device. You can wear one while you do your normal daily activities. This is usually used to diagnose what is causing palpitations/syncope (passing out).   Follow-Up: Your physician wants you to follow-up in: 3 months with Dr. Acie Fredrickson.  You will receive a reminder letter in the mail two months in advance. If you don't receive a letter, please call our office to schedule the follow-up appointment.   If you need a refill on your cardiac medications before your next appointment, please call your pharmacy.   Thank you for choosing [facility]! Christen Bame, RN [phone]

## 2015-03-22 ENCOUNTER — Ambulatory Visit (HOSPITAL_COMMUNITY): Payer: Medicare Other | Attending: Cardiovascular Disease

## 2015-03-22 ENCOUNTER — Other Ambulatory Visit: Payer: Self-pay

## 2015-03-22 ENCOUNTER — Ambulatory Visit (INDEPENDENT_AMBULATORY_CARE_PROVIDER_SITE_OTHER): Payer: Medicare Other

## 2015-03-22 DIAGNOSIS — I119 Hypertensive heart disease without heart failure: Secondary | ICD-10-CM

## 2015-03-22 DIAGNOSIS — I480 Paroxysmal atrial fibrillation: Secondary | ICD-10-CM | POA: Diagnosis present

## 2015-03-22 DIAGNOSIS — I10 Essential (primary) hypertension: Secondary | ICD-10-CM | POA: Insufficient documentation

## 2015-03-24 ENCOUNTER — Encounter: Payer: Self-pay | Admitting: Cardiovascular Disease

## 2015-05-09 ENCOUNTER — Telehealth: Payer: Self-pay | Admitting: Cardiovascular Disease

## 2015-05-09 NOTE — Telephone Encounter (Signed)
Returning a call,she did not know who called.

## 2015-05-09 NOTE — Telephone Encounter (Signed)
Reviewed cardiac monitor results with patient who verbalized understanding

## 2015-06-09 ENCOUNTER — Ambulatory Visit (INDEPENDENT_AMBULATORY_CARE_PROVIDER_SITE_OTHER): Payer: Medicare Other | Admitting: Cardiovascular Disease

## 2015-06-09 ENCOUNTER — Encounter: Payer: Self-pay | Admitting: Cardiovascular Disease

## 2015-06-09 VITALS — BP 146/88 | HR 58 | Ht 68.0 in | Wt 293.8 lb

## 2015-06-09 DIAGNOSIS — I480 Paroxysmal atrial fibrillation: Secondary | ICD-10-CM | POA: Diagnosis not present

## 2015-06-09 NOTE — Progress Notes (Signed)
Cardiology Office Note   Date:  06/09/2015   ID:  Whitney Ortiz, Whitney Ortiz [birth year], MRN [id number]  PCP:  Maylon Peppers, MD  Cardiologist:   Thayer Headings, MD   Chief Complaint  Patient presents with  . Follow-up    NO CP, NO SOB AND NO SWELLING   Problem List 1. Paroxysmal atrial fib 2. Hypothyroidism 3. Possible HTN 4.  Obstructive sleep apnea   History of Present Illness: Whitney Ortiz is a 70 y.o. female who presents for paroxysmal atrial fib.     Ive reviewed recods from [facility] physicians   Whitney Ortiz had a colonoscopy and was found to have a brief run of atrial tachycardia vs. atrail fib.   She has occasional episodes of palpitations over the years.    Last perhaps only a minute. Does cause some shortness of breath.   No dizziness.   No CP .  Does not get any regular exercise  . Does not have palpitations with exercise.   June 09, 2015:  Whitney Ortiz is doing well. She was seen several months ago for an episode of paroxysmal atrial fibrillation. Episode lasted for only 12 beats and occurred during a colonoscopy procedure. She wore 30 day event monitor and was not found have any recurrent episodes of atrial fibrillation. Her echocardiogram shows normal left ventricular systolic function. She has mild left ventricular hypertrophy.  She has OSA but has not been using her CPAP   - we discussed the fact that this would contribute to Afib    Past Medical History  Diagnosis Date  . Hypothyroidism   . 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 H/O seasonal allergies     THAT TURN INTO SINUS INFECTIONS    Past Surgical History  Procedure Laterality Date  . Abdominal hysterectomy    . Eye surgery      BILATERAL CATARACT EXTRACTIONS WITH LENS IMPLANTS  . Nasal sinus surgery  2015  . Total knee arthroplasty Bilateral 11/01/2013    Procedure: TOTAL KNEE BILATERAL;  Surgeon: Mauri Pole, MD;  Location: [facility];  Service: Orthopedics;  Laterality:  Bilateral;     Current Outpatient Prescriptions  Medication Sig Dispense Refill  . docusate sodium 100 MG CAPS Take 100 mg by mouth 2 (two) times daily. 10 capsule 0  . mometasone-formoterol (DULERA) 200-5 MCG/ACT AERO Inhale 2 puffs into the lungs 2 (two) times daily.    Marland Kitchen omeprazole (PRILOSEC) 40 MG capsule Take 40 mg by mouth daily.  0  . piroxicam (FELDENE) 20 MG capsule Take 20 mg by mouth daily.    Marland Kitchen thyroid (ARMOUR) 60 MG tablet Take 60 mg by mouth daily before breakfast.     No current facility-administered medications for this visit.    Allergies:   Penicillins    Social History:  The patient  reports that she has never smoked. She has never used smokeless tobacco. She reports that she drinks alcohol. She reports that she does not use illicit drugs.   Family History:  The patient's family history includes Diabetes in her mother; Hypertension in her father.    ROS:  Please see the history of present illness.    Review of Systems: Constitutional:  denies fever, chills, diaphoresis, appetite change and fatigue.  HEENT: denies photophobia, eye pain, redness, hearing loss, ear pain, congestion, sore throat, rhinorrhea, sneezing, neck pain, neck stiffness and tinnitus.  Respiratory: denies SOB, DOE, cough, chest  tightness, and wheezing.  Cardiovascular: denies chest pain, palpitations and leg swelling.  Gastrointestinal: denies nausea, vomiting, abdominal pain, diarrhea, constipation, blood in stool.  Genitourinary: denies dysuria, urgency, frequency, hematuria, flank pain and difficulty urinating.  Musculoskeletal: denies  myalgias, back pain, joint swelling, arthralgias and gait problem.   Skin: denies pallor, rash and wound.  Neurological: denies dizziness, seizures, syncope, weakness, light-headedness, numbness and headaches.   Hematological: denies adenopathy, easy bruising, personal or family bleeding history.  Psychiatric/ Behavioral: denies suicidal ideation, mood  changes, confusion, nervousness, sleep disturbance and agitation.       All other systems are reviewed and negative.    PHYSICAL EXAM: VS:  BP 146/88 mmHg  Pulse 58  Ht 5\' 8"  (1.727 m)  Wt 293 lb 12.8 oz (133.267 kg)  BMI 44.68 kg/m2 , BMI Body mass index is 44.68 kg/(m^2). GEN: Well nourished, well developed, in no acute distress HEENT: normal Neck: no JVD, carotid bruits, or masses Cardiac: RRR; no murmurs, rubs, or gallops,no edema  Respiratory:  clear to auscultation bilaterally, normal work of breathing GI: soft, nontender, nondistended, + BS MS: no deformity or atrophy Skin: warm and dry, no rash Neuro:  Strength and sensation are intact Psych: normal   EKG:  EKG is not ordered today. The ekg ordered 02/10/15  demonstrates a brief 12 beat run of atrial fib vs. Atrial tachycardia     Recent Labs: No results found for requested labs within last 365 days.    Lipid Panel No results found for: CHOL, TRIG, HDL, CHOLHDL, VLDL, LDLCALC, LDLDIRECT    Wt Readings from Last 3 Encounters:  06/09/15 293 lb 12.8 oz (133.267 kg)  03/21/15 291 lb 12.8 oz (132.36 kg)  11/13/13 251 lb (113.853 kg)      Other studies Reviewed: Additional studies/ records that were reviewed today include: . Review of the above records demonstrates:    ASSESSMENT AND PLAN:  1.  Paroxysmal atrial fib- Whitney Ortiz  had a 12 beat run of   paroxysmal atrial fibrillation versus atrial tachycardia.   The 30 day monitor did not show any afib . Advised her to lose weight Advised her to use the CPAP.   Will continue to follow   2. Hypothyroidism - managed by her primary medical doctor  3. Essential HTN:   BP has been high recently .   She still eats a fair amount of salt and salty foods. I've encouraged her to avoid eating salty foods. I have encouraged her to exercise on a regular basis. I've encouraged her to lose weight-all of which will help with her blood pressure control. I will see her in 3 months.  We will need to consider starting an antihypertensive if her blood pressure is still elevated.  4.  Obesity:   Have encouraged her to work on a better diet, exercise and weight loss program   Current medicines are reviewed at length with the patient today.  The patient does not have concerns regarding medicines.  The following changes have been made:  no change  Labs/ tests ordered today include:   No orders of the defined types were placed in this encounter.     Disposition:   FU with me in 3 months .      Nahser, Wonda Cheng, MD  06/09/2015 9:48 AM    [facility] [place], [place],   [address] Phone: [phone]; Fax: [phone]   [facility]  [place]  [place], [place]  [address] [phone]   Fax [phone]

## 2015-06-09 NOTE — Patient Instructions (Signed)
Medication Instructions:  Your physician recommends that you continue on your current medications as directed. Please refer to the Current Medication list given to you today.   Labwork: None Ordered   Testing/Procedures: None Ordered   Follow-Up: Your physician wants you to follow-up in: 1 year with Dr. Nahser.  You will receive a reminder letter in the mail two months in advance. If you don't receive a letter, please call our office to schedule the follow-up appointment.   If you need a refill on your cardiac medications before your next appointment, please call your pharmacy.   Thank you for choosing [facility]! Demetrius Mahler, RN [phone]    

## 2016-06-09 DIAGNOSIS — S82853A Displaced trimalleolar fracture of unspecified lower leg, initial encounter for closed fracture: Secondary | ICD-10-CM

## 2016-06-09 HISTORY — DX: Displaced trimalleolar fracture of unspecified lower leg, initial encounter for closed fracture: S82.853A

## 2016-06-11 ENCOUNTER — Other Ambulatory Visit: Payer: Self-pay | Admitting: Orthopedic Surgery

## 2016-06-11 ENCOUNTER — Encounter (HOSPITAL_BASED_OUTPATIENT_CLINIC_OR_DEPARTMENT_OTHER): Payer: Self-pay | Admitting: *Deleted

## 2016-06-11 NOTE — Pre-Procedure Instructions (Signed)
To come for EKG 

## 2016-06-12 ENCOUNTER — Encounter (HOSPITAL_BASED_OUTPATIENT_CLINIC_OR_DEPARTMENT_OTHER)
Admission: RE | Admit: 2016-06-12 | Discharge: 2016-06-12 | Disposition: A | Discharge status: Home / Self Care | Payer: Medicare Other | Source: Ambulatory Visit | Attending: Orthopedic Surgery | Admitting: Orthopedic Surgery

## 2016-06-12 DIAGNOSIS — Z961 Presence of intraocular lens: Secondary | ICD-10-CM | POA: Diagnosis not present

## 2016-06-12 DIAGNOSIS — Z79899 Other long term (current) drug therapy: Secondary | ICD-10-CM | POA: Diagnosis not present

## 2016-06-12 DIAGNOSIS — Z9841 Cataract extraction status, right eye: Secondary | ICD-10-CM | POA: Diagnosis not present

## 2016-06-12 DIAGNOSIS — Z9842 Cataract extraction status, left eye: Secondary | ICD-10-CM | POA: Diagnosis not present

## 2016-06-12 DIAGNOSIS — E039 Hypothyroidism, unspecified: Secondary | ICD-10-CM | POA: Diagnosis not present

## 2016-06-12 DIAGNOSIS — Y92832 Beach as the place of occurrence of the external cause: Secondary | ICD-10-CM | POA: Diagnosis not present

## 2016-06-12 DIAGNOSIS — Z96653 Presence of artificial knee joint, bilateral: Secondary | ICD-10-CM | POA: Diagnosis not present

## 2016-06-12 DIAGNOSIS — Z880 Allergy status to penicillin: Secondary | ICD-10-CM | POA: Diagnosis not present

## 2016-06-12 DIAGNOSIS — M4692 Unspecified inflammatory spondylopathy, cervical region: Secondary | ICD-10-CM | POA: Diagnosis not present

## 2016-06-12 DIAGNOSIS — Z90710 Acquired absence of both cervix and uterus: Secondary | ICD-10-CM | POA: Diagnosis not present

## 2016-06-12 DIAGNOSIS — Z8249 Family history of ischemic heart disease and other diseases of the circulatory system: Secondary | ICD-10-CM | POA: Diagnosis not present

## 2016-06-12 DIAGNOSIS — I10 Essential (primary) hypertension: Secondary | ICD-10-CM | POA: Diagnosis not present

## 2016-06-12 DIAGNOSIS — I480 Paroxysmal atrial fibrillation: Secondary | ICD-10-CM | POA: Diagnosis not present

## 2016-06-12 DIAGNOSIS — G4730 Sleep apnea, unspecified: Secondary | ICD-10-CM | POA: Diagnosis not present

## 2016-06-12 DIAGNOSIS — Z7982 Long term (current) use of aspirin: Secondary | ICD-10-CM | POA: Diagnosis not present

## 2016-06-12 DIAGNOSIS — S82852A Displaced trimalleolar fracture of left lower leg, initial encounter for closed fracture: Secondary | ICD-10-CM | POA: Diagnosis not present

## 2016-06-12 DIAGNOSIS — W109XXA Fall (on) (from) unspecified stairs and steps, initial encounter: Secondary | ICD-10-CM | POA: Diagnosis not present

## 2016-06-12 DIAGNOSIS — M170 Bilateral primary osteoarthritis of knee: Secondary | ICD-10-CM | POA: Diagnosis not present

## 2016-06-12 DIAGNOSIS — J45909 Unspecified asthma, uncomplicated: Secondary | ICD-10-CM | POA: Diagnosis not present

## 2016-06-12 DIAGNOSIS — Y939 Activity, unspecified: Secondary | ICD-10-CM | POA: Diagnosis not present

## 2016-06-12 DIAGNOSIS — Z833 Family history of diabetes mellitus: Secondary | ICD-10-CM | POA: Diagnosis not present

## 2016-06-12 NOTE — Progress Notes (Signed)
Dr. C. Jackson reviewed EKG and history, will proceed with surgery as scheduled. 

## 2016-06-13 ENCOUNTER — Ambulatory Visit (HOSPITAL_BASED_OUTPATIENT_CLINIC_OR_DEPARTMENT_OTHER): Payer: Medicare Other | Admitting: Certified Registered"

## 2016-06-13 ENCOUNTER — Ambulatory Visit (HOSPITAL_BASED_OUTPATIENT_CLINIC_OR_DEPARTMENT_OTHER)
Admission: RE | Admit: 2016-06-13 | Discharge: 2016-06-13 | Disposition: A | Discharge status: Home / Self Care | Payer: Medicare Other | Source: Ambulatory Visit | Attending: Orthopedic Surgery | Admitting: Orthopedic Surgery

## 2016-06-13 ENCOUNTER — Encounter (HOSPITAL_BASED_OUTPATIENT_CLINIC_OR_DEPARTMENT_OTHER): Payer: Self-pay

## 2016-06-13 ENCOUNTER — Encounter (HOSPITAL_BASED_OUTPATIENT_CLINIC_OR_DEPARTMENT_OTHER)
Admission: RE | Disposition: A | Discharge status: Home / Self Care | Payer: Self-pay | Source: Ambulatory Visit | Attending: Orthopedic Surgery

## 2016-06-13 DIAGNOSIS — Z9841 Cataract extraction status, right eye: Secondary | ICD-10-CM | POA: Insufficient documentation

## 2016-06-13 DIAGNOSIS — Z833 Family history of diabetes mellitus: Secondary | ICD-10-CM | POA: Insufficient documentation

## 2016-06-13 DIAGNOSIS — Z90710 Acquired absence of both cervix and uterus: Secondary | ICD-10-CM | POA: Insufficient documentation

## 2016-06-13 DIAGNOSIS — Z8249 Family history of ischemic heart disease and other diseases of the circulatory system: Secondary | ICD-10-CM | POA: Insufficient documentation

## 2016-06-13 DIAGNOSIS — J45909 Unspecified asthma, uncomplicated: Secondary | ICD-10-CM | POA: Diagnosis not present

## 2016-06-13 DIAGNOSIS — Z96653 Presence of artificial knee joint, bilateral: Secondary | ICD-10-CM | POA: Insufficient documentation

## 2016-06-13 DIAGNOSIS — M4692 Unspecified inflammatory spondylopathy, cervical region: Secondary | ICD-10-CM | POA: Diagnosis not present

## 2016-06-13 DIAGNOSIS — W109XXA Fall (on) (from) unspecified stairs and steps, initial encounter: Secondary | ICD-10-CM | POA: Insufficient documentation

## 2016-06-13 DIAGNOSIS — Z7982 Long term (current) use of aspirin: Secondary | ICD-10-CM | POA: Insufficient documentation

## 2016-06-13 DIAGNOSIS — S82852A Displaced trimalleolar fracture of left lower leg, initial encounter for closed fracture: Secondary | ICD-10-CM | POA: Diagnosis not present

## 2016-06-13 DIAGNOSIS — I480 Paroxysmal atrial fibrillation: Secondary | ICD-10-CM | POA: Insufficient documentation

## 2016-06-13 DIAGNOSIS — Z9842 Cataract extraction status, left eye: Secondary | ICD-10-CM | POA: Insufficient documentation

## 2016-06-13 DIAGNOSIS — Y92832 Beach as the place of occurrence of the external cause: Secondary | ICD-10-CM | POA: Insufficient documentation

## 2016-06-13 DIAGNOSIS — E039 Hypothyroidism, unspecified: Secondary | ICD-10-CM | POA: Insufficient documentation

## 2016-06-13 DIAGNOSIS — Z961 Presence of intraocular lens: Secondary | ICD-10-CM | POA: Insufficient documentation

## 2016-06-13 DIAGNOSIS — Y939 Activity, unspecified: Secondary | ICD-10-CM | POA: Insufficient documentation

## 2016-06-13 DIAGNOSIS — G4730 Sleep apnea, unspecified: Secondary | ICD-10-CM | POA: Insufficient documentation

## 2016-06-13 DIAGNOSIS — Z880 Allergy status to penicillin: Secondary | ICD-10-CM | POA: Insufficient documentation

## 2016-06-13 DIAGNOSIS — M170 Bilateral primary osteoarthritis of knee: Secondary | ICD-10-CM | POA: Insufficient documentation

## 2016-06-13 DIAGNOSIS — Z79899 Other long term (current) drug therapy: Secondary | ICD-10-CM | POA: Insufficient documentation

## 2016-06-13 DIAGNOSIS — I10 Essential (primary) hypertension: Secondary | ICD-10-CM | POA: Insufficient documentation

## 2016-06-13 HISTORY — DX: Paroxysmal atrial fibrillation: I48.0

## 2016-06-13 HISTORY — DX: Sleep apnea, unspecified: G47.30

## 2016-06-13 HISTORY — DX: Unspecified osteoarthritis, unspecified site: M19.90

## 2016-06-13 HISTORY — DX: Displaced trimalleolar fracture of unspecified lower leg, initial encounter for closed fracture: S82.853A

## 2016-06-13 HISTORY — DX: Family history of other specified conditions: Z84.89

## 2016-06-13 HISTORY — PX: ORIF ANKLE FRACTURE: SHX5408

## 2016-06-13 HISTORY — DX: Unspecified asthma, uncomplicated: J45.909

## 2016-06-13 HISTORY — DX: Dental restoration status: Z98.811

## 2016-06-13 SURGERY — OPEN REDUCTION INTERNAL FIXATION (ORIF) ANKLE FRACTURE
Anesthesia: Monitor Anesthesia Care | Site: Ankle | Laterality: Left

## 2016-06-13 MED ORDER — OXYCODONE HCL 5 MG PO TABS: 5.0000 mg | ORAL_TABLET | Freq: Once | ORAL | Status: DC | PRN

## 2016-06-13 MED ORDER — MIDAZOLAM HCL 2 MG/2ML IJ SOLN
INTRAMUSCULAR | Status: AC
  Filled 2016-06-13: qty 2

## 2016-06-13 MED ORDER — BUPIVACAINE HCL (PF) 0.5 % IJ SOLN
INTRAMUSCULAR | Status: AC
  Filled 2016-06-13: qty 30

## 2016-06-13 MED ORDER — SCOPOLAMINE 1 MG/3DAYS TD PT72: 1.0000 | MEDICATED_PATCH | Freq: Once | TRANSDERMAL | Status: DC | PRN

## 2016-06-13 MED ORDER — FENTANYL CITRATE (PF) 100 MCG/2ML IJ SOLN
INTRAMUSCULAR | Status: AC
  Filled 2016-06-13: qty 2

## 2016-06-13 MED ORDER — MEPERIDINE HCL 25 MG/ML IJ SOLN: 6.2500 mg | INTRAMUSCULAR | Status: DC | PRN

## 2016-06-13 MED ORDER — NAPROXEN SODIUM 220 MG PO TABS: 440.0000 mg | ORAL_TABLET | Freq: Two times a day (BID) | ORAL | 0 refills | Status: AC

## 2016-06-13 MED ORDER — FENTANYL CITRATE (PF) 100 MCG/2ML IJ SOLN: 25.0000 ug | INTRAMUSCULAR | Status: DC | PRN

## 2016-06-13 MED ORDER — EPHEDRINE 5 MG/ML INJ
INTRAVENOUS | Status: AC
  Filled 2016-06-13: qty 10

## 2016-06-13 MED ORDER — OXYCODONE HCL 5 MG/5ML PO SOLN: 5.0000 mg | Freq: Once | ORAL | Status: DC | PRN

## 2016-06-13 MED ORDER — PROPOFOL 10 MG/ML IV BOLUS
INTRAVENOUS | Status: DC | PRN
  Administered 2016-06-13: 150 mg via INTRAVENOUS

## 2016-06-13 MED ORDER — HYDROMORPHONE HCL 1 MG/ML IJ SOLN: 0.2500 mg | INTRAMUSCULAR | Status: DC | PRN

## 2016-06-13 MED ORDER — CEFAZOLIN SODIUM-DEXTROSE 2-4 GM/100ML-% IV SOLN
2.0000 g | INTRAVENOUS | Status: AC
  Administered 2016-06-13: 3 g via INTRAVENOUS

## 2016-06-13 MED ORDER — ASPIRIN EC 81 MG PO TBEC: 81.0000 mg | DELAYED_RELEASE_TABLET | Freq: Two times a day (BID) | ORAL | Status: AC

## 2016-06-13 MED ORDER — LIDOCAINE 2% (20 MG/ML) 5 ML SYRINGE
INTRAMUSCULAR | Status: DC | PRN
  Administered 2016-06-13: 30 mg via INTRAVENOUS

## 2016-06-13 MED ORDER — ONDANSETRON HCL 4 MG/2ML IJ SOLN
INTRAMUSCULAR | Status: AC
  Filled 2016-06-13: qty 2

## 2016-06-13 MED ORDER — FENTANYL CITRATE (PF) 100 MCG/2ML IJ SOLN
50.0000 ug | INTRAMUSCULAR | Status: DC | PRN
  Administered 2016-06-13: 100 ug via INTRAVENOUS
  Administered 2016-06-13: 50 ug via INTRAVENOUS

## 2016-06-13 MED ORDER — DEXAMETHASONE SODIUM PHOSPHATE 10 MG/ML IJ SOLN
INTRAMUSCULAR | Status: AC
  Filled 2016-06-13: qty 1

## 2016-06-13 MED ORDER — ONDANSETRON HCL 4 MG/2ML IJ SOLN
INTRAMUSCULAR | Status: DC | PRN
  Administered 2016-06-13: 4 mg via INTRAVENOUS

## 2016-06-13 MED ORDER — LIDOCAINE 2% (20 MG/ML) 5 ML SYRINGE
INTRAMUSCULAR | Status: AC
  Filled 2016-06-13: qty 5

## 2016-06-13 MED ORDER — ONDANSETRON HCL 4 MG/2ML IJ SOLN: 4.0000 mg | Freq: Once | INTRAMUSCULAR | Status: DC | PRN

## 2016-06-13 MED ORDER — MIDAZOLAM HCL 2 MG/2ML IJ SOLN
1.0000 mg | INTRAMUSCULAR | Status: DC | PRN
  Administered 2016-06-13: 2 mg via INTRAVENOUS

## 2016-06-13 MED ORDER — BUPIVACAINE-EPINEPHRINE (PF) 0.5% -1:200000 IJ SOLN
INTRAMUSCULAR | Status: AC
  Filled 2016-06-13: qty 7.2

## 2016-06-13 MED ORDER — OXYCODONE HCL 5 MG PO TABS: 5.0000 mg | ORAL_TABLET | ORAL | 0 refills | Status: DC | PRN

## 2016-06-13 MED ORDER — LIDOCAINE HCL (CARDIAC) 20 MG/ML IV SOLN: INTRAVENOUS | Status: DC | PRN

## 2016-06-13 MED ORDER — DEXAMETHASONE SODIUM PHOSPHATE 10 MG/ML IJ SOLN
INTRAMUSCULAR | Status: DC | PRN
  Administered 2016-06-13: 8 mg via INTRAVENOUS

## 2016-06-13 MED ORDER — LACTATED RINGERS IV SOLN
INTRAVENOUS | Status: DC
  Administered 2016-06-13 (×2): via INTRAVENOUS

## 2016-06-13 MED ORDER — CHLORHEXIDINE GLUCONATE 4 % EX LIQD: 60.0000 mL | Freq: Once | CUTANEOUS | Status: DC

## 2016-06-13 MED ORDER — CEFAZOLIN SODIUM-DEXTROSE 2-4 GM/100ML-% IV SOLN
INTRAVENOUS | Status: AC
  Filled 2016-06-13: qty 200

## 2016-06-13 MED ORDER — EPHEDRINE SULFATE-NACL 50-0.9 MG/10ML-% IV SOSY
PREFILLED_SYRINGE | INTRAVENOUS | Status: DC | PRN
  Administered 2016-06-13: 10 mg via INTRAVENOUS

## 2016-06-13 SURGICAL SUPPLY — 74 items
BANDAGE ESMARK 6X9 LF (GAUZE/BANDAGES/DRESSINGS) ×1 IMPLANT
BIT DRILL 2.5X2.75 QC CALB (BIT) ×1 IMPLANT
BIT DRILL 2.9 CANN QC NONSTRL (BIT) ×1 IMPLANT
BLADE SURG 15 STRL LF DISP TIS (BLADE) ×2 IMPLANT
BLADE SURG 15 STRL SS (BLADE) ×4
BNDG CMPR 9X4 STRL LF SNTH (GAUZE/BANDAGES/DRESSINGS)
BNDG CMPR 9X6 STRL LF SNTH (GAUZE/BANDAGES/DRESSINGS) ×1
BNDG COHESIVE 4X5 TAN STRL (GAUZE/BANDAGES/DRESSINGS) ×2 IMPLANT
BNDG COHESIVE 6X5 TAN STRL LF (GAUZE/BANDAGES/DRESSINGS) ×2 IMPLANT
BNDG ESMARK 4X9 LF (GAUZE/BANDAGES/DRESSINGS) IMPLANT
BNDG ESMARK 6X9 LF (GAUZE/BANDAGES/DRESSINGS) ×2
CANISTER SUCT 1200ML W/VALVE (MISCELLANEOUS) ×2 IMPLANT
CHLORAPREP W/TINT 26ML (MISCELLANEOUS) ×2 IMPLANT
COVER BACK TABLE 60X90IN (DRAPES) ×2 IMPLANT
CUFF TOURNIQUET SINGLE 34IN LL (TOURNIQUET CUFF) IMPLANT
DECANTER SPIKE VIAL GLASS SM (MISCELLANEOUS) IMPLANT
DRAPE EXTREMITY T 121X128X90 (DRAPE) ×2 IMPLANT
DRAPE OEC MINIVIEW 54X84 (DRAPES) ×2 IMPLANT
DRAPE U-SHAPE 47X51 STRL (DRAPES) ×2 IMPLANT
DRSG MEPITEL 4X7.2 (GAUZE/BANDAGES/DRESSINGS) ×2 IMPLANT
DRSG PAD ABDOMINAL 8X10 ST (GAUZE/BANDAGES/DRESSINGS) ×4 IMPLANT
ELECT REM PT RETURN 9FT ADLT (ELECTROSURGICAL) ×2
ELECTRODE REM PT RTRN 9FT ADLT (ELECTROSURGICAL) ×1 IMPLANT
GAUZE SPONGE 4X4 12PLY STRL (GAUZE/BANDAGES/DRESSINGS) ×2 IMPLANT
GLOVE BIO SURGEON STRL SZ 6.5 (GLOVE) ×1 IMPLANT
GLOVE BIO SURGEON STRL SZ8 (GLOVE) ×2 IMPLANT
GLOVE BIOGEL PI IND STRL 7.0 (GLOVE) IMPLANT
GLOVE BIOGEL PI IND STRL 8 (GLOVE) ×2 IMPLANT
GLOVE BIOGEL PI INDICATOR 7.0 (GLOVE) ×2
GLOVE BIOGEL PI INDICATOR 8 (GLOVE) ×2
GLOVE ECLIPSE 8.0 STRL XLNG CF (GLOVE) ×2 IMPLANT
GOWN STRL REUS W/ TWL LRG LVL3 (GOWN DISPOSABLE) ×1 IMPLANT
GOWN STRL REUS W/ TWL XL LVL3 (GOWN DISPOSABLE) ×2 IMPLANT
GOWN STRL REUS W/TWL LRG LVL3 (GOWN DISPOSABLE) ×2
GOWN STRL REUS W/TWL XL LVL3 (GOWN DISPOSABLE) ×4
K-WIRE ACE 1.6X6 (WIRE) ×4
KWIRE ACE 1.6X6 (WIRE) IMPLANT
NEEDLE HYPO 22GX1.5 SAFETY (NEEDLE) IMPLANT
NS IRRIG 1000ML POUR BTL (IV SOLUTION) ×2 IMPLANT
PACK BASIN DAY SURGERY FS (CUSTOM PROCEDURE TRAY) ×2 IMPLANT
PAD CAST 4YDX4 CTTN HI CHSV (CAST SUPPLIES) ×1 IMPLANT
PADDING CAST ABS 4INX4YD NS (CAST SUPPLIES)
PADDING CAST ABS COTTON 4X4 ST (CAST SUPPLIES) IMPLANT
PADDING CAST COTTON 4X4 STRL (CAST SUPPLIES) ×2
PADDING CAST COTTON 6X4 STRL (CAST SUPPLIES) ×2 IMPLANT
PENCIL BUTTON HOLSTER BLD 10FT (ELECTRODE) ×2 IMPLANT
PLATE LOCK 7H 92 BILAT FIB (Plate) ×1 IMPLANT
SANITIZER HAND PURELL 535ML FO (MISCELLANEOUS) ×2 IMPLANT
SCREW ACE CAN 4.0 40M (Screw) ×2 IMPLANT
SCREW LOCK 3.5X10 DIST TIB (Screw) ×1 IMPLANT
SCREW LOCK CORT STAR 3.5X12 (Screw) ×3 IMPLANT
SCREW LOW PROFILE 18MMX3.5MM (Screw) ×1 IMPLANT
SCREW NON LOCKING LP 3.5 16MM (Screw) ×2 IMPLANT
SHEET MEDIUM DRAPE 40X70 STRL (DRAPES) ×2 IMPLANT
SLEEVE SCD COMPRESS KNEE MED (MISCELLANEOUS) ×2 IMPLANT
SPLINT FAST PLASTER 5X30 (CAST SUPPLIES) ×20
SPLINT PLASTER CAST FAST 5X30 (CAST SUPPLIES) ×20 IMPLANT
SPONGE LAP 18X18 X RAY DECT (DISPOSABLE) ×2 IMPLANT
STOCKINETTE 6  STRL (DRAPES) ×1
STOCKINETTE 6 STRL (DRAPES) ×1 IMPLANT
SUCTION FRAZIER HANDLE 10FR (MISCELLANEOUS) ×1
SUCTION TUBE FRAZIER 10FR DISP (MISCELLANEOUS) ×1 IMPLANT
SUT ETHILON 3 0 PS 1 (SUTURE) ×2 IMPLANT
SUT FIBERWIRE #2 38 T-5 BLUE (SUTURE)
SUT MNCRL AB 3-0 PS2 18 (SUTURE) IMPLANT
SUT VIC AB 0 SH 27 (SUTURE) IMPLANT
SUT VIC AB 2-0 SH 27 (SUTURE) ×2
SUT VIC AB 2-0 SH 27XBRD (SUTURE) ×1 IMPLANT
SUTURE FIBERWR #2 38 T-5 BLUE (SUTURE) IMPLANT
SYR BULB 3OZ (MISCELLANEOUS) ×2 IMPLANT
SYR CONTROL 10ML LL (SYRINGE) IMPLANT
TOWEL OR 17X24 6PK STRL BLUE (TOWEL DISPOSABLE) ×4 IMPLANT
TUBE CONNECTING 20X1/4 (TUBING) ×2 IMPLANT
UNDERPAD 30X30 (UNDERPADS AND DIAPERS) ×2 IMPLANT

## 2016-06-13 NOTE — Transfer of Care (Signed)
Immediate Anesthesia Transfer of Care Note  Patient: Whitney Ortiz  Procedure(s) Performed: Procedure(s): OPEN REDUCTION INTERNAL FIXATION (ORIF) Trimalleloar ANKLE FRACTURE (Left)  Patient Location: PACU  Anesthesia Type:General  Level of Consciousness: awake and sedated  Airway & Oxygen Therapy: Patient Spontanous Breathing and Patient connected to face mask oxygen  Post-op Assessment: Report given to RN and Post -op Vital signs reviewed and stable  Post vital signs: Reviewed and stable  Last Vitals:  Vitals:   06/13/16 1120 06/13/16 1122  BP: (!) 142/85   Pulse: (!) 50 (!) 51  Resp: 16 12  Temp:      Last Pain:  Vitals:   06/13/16 1051  TempSrc: Oral         Complications: No apparent anesthesia complications

## 2016-06-13 NOTE — Anesthesia Procedure Notes (Signed)
Date/Time: 06/13/2016 12:34 PM Performed by: Melynda Ripple D

## 2016-06-13 NOTE — H&P (Signed)
Whitney Ortiz is an 71 y.o. female.   Chief Complaint: left ankle pain HPI: 71 y/o female with left ankle trimal fracture [place]  She presents today for open treatment of the left ankle injury.  Past Medical History:  Diagnosis Date  . Arthritis    neck  . Asthma    daily inhaler  . Dental crowns present    also dental caps  . Family history of adverse reaction to anesthesia    pt's sister has hx. of post-op N/V  . Hypothyroidism   . Osteoarthritis    knees  . PAF (paroxysmal atrial fibrillation) (HCC)    episodic  . Sleep apnea    no CPAP use  . Trimalleolar fracture of ankle, closed 06/2016   left    Past Surgical History:  Procedure Laterality Date  . ABDOMINAL HYSTERECTOMY     complete  . CATARACT EXTRACTION W/ INTRAOCULAR LENS  IMPLANT, BILATERAL    . TOTAL KNEE ARTHROPLASTY Bilateral 11/01/2013   Procedure: TOTAL KNEE BILATERAL;  Surgeon: Mauri Pole, MD;  Location: [facility];  Service: Orthopedics;  Laterality: Bilateral;  . TURBINATE REDUCTION      Family History  Problem Relation Age of Onset  . Diabetes Mother   . Hypertension Father   . Anesthesia problems Sister     post-op N/V   Social History:  reports that she has never smoked. She has never used smokeless tobacco. She reports that she drinks alcohol. She reports that she does not use drugs.  Allergies:  Allergies  Allergen Reactions  . Penicillins Rash    Medications Prior to Admission  Medication Sig Dispense Refill  . aspirin EC 81 MG tablet Take 81 mg by mouth daily.    . cholecalciferol (VITAMIN D) 1000 units tablet Take 1,000 Units by mouth daily.    Marland Kitchen docusate sodium 100 MG CAPS Take 100 mg by mouth 2 (two) times daily. 10 capsule 0  . mometasone-formoterol (DULERA) 200-5 MCG/ACT AERO Inhale 2 puffs into the lungs 2 (two) times daily.    . piroxicam (FELDENE) 20 MG capsule Take 20 mg by mouth daily.    Marland Kitchen thyroid (ARMOUR) 60 MG tablet Take 60 mg by mouth daily  before breakfast.    . vitamin C (ASCORBIC ACID) 500 MG tablet Take 500 mg by mouth daily.      No results found for this or any previous visit (from the past 48 hour(s)). No results found.  ROS  No recent f/c/n/v/wt loss  Blood pressure (!) 142/85, pulse (!) 51, temperature 98 F (36.7 C), temperature source Oral, resp. rate 12, height 5\' 8"  (1.727 m), weight 129.7 kg (286 lb), SpO2 99 %. Physical Exam  Obese female in nad.  A and o x 4.  Mood and affect normal.  EOMI.  resp unlabored.  L ankle with healthy skin.  No lymphadenopathy.  5/5 strength in PF and DF of the ankle and toes.  Sens to LT intact at the foot dorsally and plantarly.  Brisk cap refill at the toes.  Assessment/Plan Left ankle trimal fracture - to OR for ORIF.  The risks and benefits of the alternative treatment options have been discussed in detail.  The patient wishes to proceed with surgery and specifically understands risks of bleeding, infection, nerve damage, blood clots, need for additional surgery, amputation and death.   Wylene Simmer, MD 2016-06-15, 11:44 AM

## 2016-06-13 NOTE — Anesthesia Procedure Notes (Signed)
Procedure Name: LMA Insertion Performed by: Naidelin Gugliotta W Pre-anesthesia Checklist: Patient identified, Emergency Drugs available, Suction available and Patient being monitored Patient Re-evaluated:Patient Re-evaluated prior to inductionOxygen Delivery Method: Circle system utilized Preoxygenation: Pre-oxygenation with 100% oxygen Intubation Type: IV induction Ventilation: Mask ventilation without difficulty LMA: LMA inserted LMA Size: 4.0 Number of attempts: 1 Placement Confirmation: positive ETCO2 Tube secured with: Tape Dental Injury: Teeth and Oropharynx as per pre-operative assessment        

## 2016-06-13 NOTE — Brief Op Note (Signed)
06/13/2016  1:36 PM  PATIENT:  Whitney Ortiz  71 y.o. female  PRE-OPERATIVE DIAGNOSIS:  Left ankle trimalleolar fracture  POST-OPERATIVE DIAGNOSIS:  Left ankle trimalleolar fracture  Procedure(s): 1.  Open treatment of left ankle trimalleolar fracture with internal fixation without fixation of the posterior lip 2.  Stress exam of left ankle under fluoro 3.  AP, mortise and lateral xrays of the left ankle  SURGEON:  Wylene Simmer, MD  ASSISTANT: n/a  ANESTHESIA:   General, regional  EBL:  minimal   TOURNIQUET:   Total Tourniquet Time Documented: Thigh (Left) - 51 minutes Total: Thigh (Left) - 51 minutes  COMPLICATIONS:  None apparent  DISPOSITION:  Extubated, awake and stable to recovery.  DICTATION ID:  [id number]

## 2016-06-13 NOTE — Discharge Instructions (Signed)
Regional Anesthesia Blocks  1. Numbness or the inability to move the "blocked" extremity may last from 3-48 hours after placement. The length of time depends on the medication injected and your individual response to the medication. If the numbness is not going away after 48 hours, call your surgeon.  2. The extremity that is blocked will need to be protected until the numbness is gone and the  Strength has returned. Because you cannot feel it, you will need to take extra care to avoid injury. Because it may be weak, you may have difficulty moving it or using it. You may not know what position it is in without looking at it while the block is in effect.  3. For blocks in the legs and feet, returning to weight bearing and walking needs to be done carefully. You will need to wait until the numbness is entirely gone and the strength has returned. You should be able to move your leg and foot normally before you try and bear weight or walk. You will need someone to be with you when you first try to ensure you do not fall and possibly risk injury.  4. Bruising and tenderness at the needle site are common side effects and will resolve in a few days.  5. Persistent numbness or new problems with movement should be communicated to the surgeon or the [facility] [phone] [place] [phone]).  Post Anesthesia Home Care Instructions  Activity: Get plenty of rest for the remainder of the day. A responsible individual must stay with you for 24 hours following the procedure.  For the next 24 hours, DO NOT: -Drive a car -Paediatric nurse -Drink alcoholic beverages -Take any medication unless instructed by your physician -Make any legal decisions or sign important papers.  Meals: Start with liquid foods such as gelatin or soup. Progress to regular foods as tolerated. Avoid greasy, spicy, heavy foods. If nausea and/or vomiting occur, drink only clear liquids until the  nausea and/or vomiting subsides. Call your physician if vomiting continues.  Special Instructions/Symptoms: Your throat may feel dry or sore from the anesthesia or the breathing tube placed in your throat during surgery. If this causes discomfort, gargle with warm salt water. The discomfort should disappear within 24 hours.  If you had a scopolamine patch placed behind your ear for the management of post- operative nausea and/or vomiting:  1. The medication in the patch is effective for 72 hours, after which it should be removed.  Wrap patch in a tissue and discard in the trash. Wash hands thoroughly with soap and water. 2. You may remove the patch earlier than 72 hours if you experience unpleasant side effects which may include dry mouth, dizziness or visual disturbances. 3. Avoid touching the patch. Wash your hands with soap and water after contact with the patch.   Wylene Simmer, MD [place]  Please read the following information regarding your care after surgery.  Medications  You only need a prescription for the narcotic pain medicine (ex. oxycodone, Percocet, Norco).  All of the other medicines listed below are available over the counter. X acetominophen (Tylenol) 650 mg every 4-6 hours as you need for minor pain X oxycodone as prescribed for moderate to severe pain X Aleve 2 pills twice a day for the first three days after surgery  Narcotic pain medicine (ex. oxycodone, Percocet, Vicodin) will cause constipation.  To prevent this problem, take the following medicines while you are taking any pain  medicine. X docusate sodium (Colace) 100 mg twice a day X senna (Senokot) 2 tablets twice a day  X To help prevent blood clots, take a baby aspirin (81 mg) twice a day for two weeks after surgery.  You should also get up every hour while you are awake to move around.    Weight Bearing ? Bear weight when you are able on your operated leg or foot. ? Bear weight only on the heel  of your operated foot in the post-op shoe. X Do not bear any weight on the operated leg or foot.  Cast / Splint / Dressing X Keep your splint or cast clean and dry.  Dont put anything (coat hanger, pencil, etc) down inside of it.  If it gets damp, use a hair dryer on the cool setting to dry it.  If it gets soaked, call the office to schedule an appointment for a cast change. ? Remove your dressing 3 days after surgery and cover the incisions with dry dressings.    After your dressing, cast or splint is removed; you may shower, but do not soak or scrub the wound.  Allow the water to run over it, and then gently pat it dry.  Swelling It is normal for you to have swelling where you had surgery.  To reduce swelling and pain, keep your toes above your nose for at least 3 days after surgery.  It may be necessary to keep your foot or leg elevated for several weeks.  If it hurts, it should be elevated.  Follow Up Call my office at [phone] when you are discharged from the hospital or surgery center to schedule an appointment to be seen two weeks after surgery.  Call my office at [phone] if you develop a fever >101.5 F, nausea, vomiting, bleeding from the surgical site or severe pain.

## 2016-06-13 NOTE — Anesthesia Postprocedure Evaluation (Signed)
Anesthesia Post Note  Patient: Whitney Ortiz  Procedure(s) Performed: Procedure(s) (LRB): OPEN REDUCTION INTERNAL FIXATION (ORIF) Trimalleloar ANKLE FRACTURE (Left)  Patient location during evaluation: PACU Anesthesia Type: Regional Level of consciousness: awake and alert Pain management: pain level controlled Vital Signs Assessment: post-procedure vital signs reviewed and stable Respiratory status: spontaneous breathing, nonlabored ventilation, respiratory function stable and patient connected to nasal cannula oxygen Cardiovascular status: blood pressure returned to baseline and stable Postop Assessment: no signs of nausea or vomiting Anesthetic complications: no       Last Vitals:  Vitals:   06/13/16 1339 06/13/16 1345  BP:  140/68  Pulse:  66  Resp:  17  Temp: 36.5 C     Last Pain:  Vitals:   06/13/16 1339  TempSrc:   PainSc: [place]

## 2016-06-13 NOTE — Anesthesia Procedure Notes (Deleted)
Anesthesia Regional Block: Narrative:       

## 2016-06-13 NOTE — Anesthesia Preprocedure Evaluation (Signed)
Anesthesia Evaluation  Patient identified by MRN, date of birth, ID band Patient awake    Reviewed: Allergy & Precautions, NPO status , Patient's Chart, lab work & pertinent test results  Airway Mallampati: I  TM Distance: >3 FB Neck ROM: Full    Dental   Pulmonary asthma , sleep apnea ,    Pulmonary exam normal        Cardiovascular hypertension, Pt. on medications Normal cardiovascular exam     Neuro/Psych    GI/Hepatic   Endo/Other    Renal/GU      Musculoskeletal   Abdominal   Peds  Hematology   Anesthesia Other Findings   Reproductive/Obstetrics                             Anesthesia Physical Anesthesia Plan  ASA: II  Anesthesia Plan: MAC and Regional   Post-op Pain Management:    Induction: Intravenous  Airway Management Planned: Simple Face Mask  Additional Equipment:   Intra-op Plan:   Post-operative Plan:   Informed Consent: I have reviewed the patients History and Physical, chart, labs and discussed the procedure including the risks, benefits and alternatives for the proposed anesthesia with the patient or authorized representative who has indicated his/her understanding and acceptance.     Plan Discussed with: CRNA and Surgeon  Anesthesia Plan Comments:         Anesthesia Quick Evaluation

## 2016-06-13 NOTE — Op Note (Signed)
NAMEJANNA, OAK NO.:  [id number]  MEDICAL RECORD NO.:  [id number]  LOCATION:                                 FACILITY:  PHYSICIAN:  Wylene Simmer, MD             DATE OF BIRTH:  DATE OF PROCEDURE:  06/13/2016 DATE OF DISCHARGE:                              OPERATIVE REPORT   PREOPERATIVE DIAGNOSIS:  Left ankle trimalleolar fracture.  POSTOPERATIVE DIAGNOSIS:  Left ankle trimalleolar fracture.  PROCEDURE: 1. Open treatment of left ankle trimalleolar fracture with internal     fixation without fixation of the posterior lip. 2. Stress examination of the left ankle under fluoroscopy. 3. AP, mortise, and lateral radiographs of the left ankle.  SURGEON:  Wylene Simmer, MD.  ANESTHESIA:  General, regional.  ESTIMATED BLOOD LOSS:  Minimal.  TOURNIQUET TIME:  51 minutes at 350 mmHg.  COMPLICATIONS:  None apparent.  DISPOSITION:  Extubated, awake, and stable to recovery.  INDICATIONS FOR PROCEDURE:  The patient is a 71 year old woman, [place]  She injured her left ankle.  She was seen at the local emergency department, where she was found to have a trimalleolar fracture dislocation.  She underwent closed reduction and splinting.  She returned to [facility] and now presents for open treatment of this unstable and displaced left ankle injury.  She understands the risks and benefits of the alternative treatment options and elects surgical treatment.  She specifically understands risks of bleeding, infection, nerve damage, blood clots, need for additional surgery, continued pain, nonunion, posttraumatic arthritis, amputation, and death.  PROCEDURE IN DETAIL:  After preoperative consent was obtained and the correct operative site was identified, the patient was brought to the operating room and placed supine on the operating table.  General anesthesia was induced.  Preoperative antibiotics were administered. Surgical  time-out was taken.  The left lower extremity was prepped and draped in standard sterile fashion with a tourniquet around the thigh. The extremity was exsanguinated and the tourniquet was inflated to 350 mmHg.  A longitudinal incision was made over the lateral malleolus. Sharp dissection was carried down through the skin and subcutaneous tissue.  The fracture site was identified.  It was cleaned of all hematoma.  The fracture was noted to be a short oblique fracture with some comminution proximally.  A lag screw was not possible with this fracture configuration.  A [employer] ALPS composite plate was then selected.  It was contoured to fit the lateral malleolus.  It was secured distally with locking and nonlocking screws.  It was then used as a reduction tool to reduce and compress the fracture appropriately. It was then secured proximally with combination of 3 locking and nonlocking screws.  AP and lateral radiographs confirmed appropriate reduction of the fibular fracture and appropriate position and length of the hardware.  Attention was then turned to the medial ankle where a longitudinal incision was made over the medial malleolus.  Sharp dissection was carried down through the skin and subcutaneous tissue.  Fracture site was identified.  It was cleaned of all hematoma and periosteum and  irrigated copiously.  The fracture was reduced and provisionally held with a tenaculum.  Two K-wires were placed across the fracture site.  AP and lateral radiographs confirmed appropriate positioning of the K- wires.  A 4 mm x 40 mm partially threaded cannulated screws were then inserted over the guidewires.  Both were noted to have excellent purchase and compressed the fracture site appropriately.  AP, mortise, and lateral radiographs confirmed appropriate position length of all hardware and appropriate reduction of the medial, lateral, and posterior malleolus fractures.  Posterior malleolus fracture  was appropriately reduced and was small enough to not require additional fixation.  Stress examination was then performed.  Dorsiflexion and external rotation stress were applied to the supinated forefoot.  This was done with a live mortise view under fluoroscopy.  No widening of the ankle mortise or syndesmosis was noted. Medial and lateral incisions were irrigated copiously and closed with Vicryl, Monocryl, and nylon.  Sterile dressings were applied, followed by a well-padded short-leg splint.  Tourniquet was released after application of the dressings at 51 minutes.  The patient was awakened from anesthesia and transported to the recovery room in stable condition.  FOLLOWUP PLAN:  The patient will be nonweightbearing on the left lower extremity.  She will follow up with me in the office in 2 weeks for suture removal and conversion to a short CAM walker boot.  We will plan to initiate early range of motion, but hold on weightbearing until a month postop.  RADIOGRAPHS:  AP, mortise, and lateral radiographs of the left ankle were obtained intraoperatively.  These show interval reduction and fixation of the trimalleolar ankle fracture.  Hardware is appropriately positioned and of the appropriate length.  No other acute injuries are noted.     Wylene Simmer, MD     JH/MEDQ  D:  06/13/2016  T:  06/13/2016  Job:  [id number]

## 2016-06-13 NOTE — Anesthesia Procedure Notes (Signed)
Anesthesia Regional Block: Popliteal block   Pre-Anesthetic Checklist: ,, timeout performed, Correct Patient, Correct Site, Correct Laterality, Correct Procedure, Correct Position, site marked, Risks and benefits discussed,  Surgical consent,  Pre-op evaluation,  At surgeon's request and post-op pain management  Laterality: Left  Prep: chloraprep       Needles:  Injection technique: Single-shot  Needle Type: Echogenic Stimulator Needle     Needle Length: 9cm  Needle Gauge: 21     Additional Needles:   Procedures: ultrasound guided, nerve stimulator,,,,,,   Nerve Stimulator or Paresthesia:  Response: 0.4 mA,   Additional Responses:   Narrative:  Start time: 06/13/2016 11:10 AM End time: 06/13/2016 11:20 AM Injection made incrementally with aspirations every 5 mL.  Performed by: Personally  Anesthesiologist: Lillia Abed  Additional Notes: Monitors applied. Patient sedated. Sterile prep and drape,hand hygiene and sterile gloves were used. Relevant anatomy identified.Needle position confirmed.Local anesthetic injected incrementally after negative aspiration. Local anesthetic spread visualized around nerve(s). Vascular puncture avoided. No complications. Image printed for medical record.The patient tolerated the procedure well.  Additional Saphenous nerve block performed. 15cc Local Anesthetic mixture placed under ultrasonic guidance along the medio-inferior border of the Sartorious muscle 6 inches above the knee.  No Problems encountered.  Lillia Abed MD

## 2016-06-13 NOTE — Op Note (Signed)
NAMECABRINA, SHIROMA NO.:  [id number]  MEDICAL RECORD NO.:  [id number]  LOCATION:                                 FACILITY:  PHYSICIAN:  Wylene Simmer, MD             DATE OF BIRTH:  DATE OF PROCEDURE:  06/13/2016 DATE OF DISCHARGE:                              OPERATIVE REPORT   PREOPERATIVE DIAGNOSIS:  Left ankle trimalleolar fracture dislocation.  POSTOPERATIVE DIAGNOSIS:  Left ankle trimalleolar fracture dislocation.  PROCEDURE: 1. Open treatment of left ankle trimalleolar fracture with internal     fixation.  DICTATION ENDS HERE.     Wylene Simmer, MD     JH/MEDQ  D:  06/13/2016  T:  06/13/2016  Job:  [id number]

## 2016-06-13 NOTE — Progress Notes (Signed)
Assisted Dr. Ossey with left, ultrasound guided, popliteal/saphenous block. Side rails up, monitors on throughout procedure. See vital signs in flow sheet. Tolerated Procedure well. 

## 2016-06-14 ENCOUNTER — Encounter (HOSPITAL_BASED_OUTPATIENT_CLINIC_OR_DEPARTMENT_OTHER): Payer: Self-pay | Admitting: Orthopedic Surgery

## 2016-06-16 ENCOUNTER — Encounter (HOSPITAL_BASED_OUTPATIENT_CLINIC_OR_DEPARTMENT_OTHER): Payer: Self-pay | Admitting: Emergency Medicine

## 2016-06-16 ENCOUNTER — Emergency Department (HOSPITAL_BASED_OUTPATIENT_CLINIC_OR_DEPARTMENT_OTHER): Payer: Medicare Other

## 2016-06-16 ENCOUNTER — Emergency Department (HOSPITAL_BASED_OUTPATIENT_CLINIC_OR_DEPARTMENT_OTHER)
Admission: EM | Admit: 2016-06-16 | Discharge: 2016-06-16 | Disposition: A | Discharge status: Home / Self Care | Payer: Medicare Other | Attending: Emergency Medicine | Admitting: Emergency Medicine

## 2016-06-16 DIAGNOSIS — I10 Essential (primary) hypertension: Secondary | ICD-10-CM | POA: Diagnosis not present

## 2016-06-16 DIAGNOSIS — R0602 Shortness of breath: Secondary | ICD-10-CM | POA: Diagnosis present

## 2016-06-16 DIAGNOSIS — J45909 Unspecified asthma, uncomplicated: Secondary | ICD-10-CM | POA: Insufficient documentation

## 2016-06-16 DIAGNOSIS — E039 Hypothyroidism, unspecified: Secondary | ICD-10-CM | POA: Insufficient documentation

## 2016-06-16 DIAGNOSIS — Z7982 Long term (current) use of aspirin: Secondary | ICD-10-CM | POA: Insufficient documentation

## 2016-06-16 DIAGNOSIS — J189 Pneumonia, unspecified organism: Secondary | ICD-10-CM | POA: Insufficient documentation

## 2016-06-16 DIAGNOSIS — Z79899 Other long term (current) drug therapy: Secondary | ICD-10-CM | POA: Diagnosis not present

## 2016-06-16 LAB — BASIC METABOLIC PANEL
Anion gap: 7 (ref 5–15)
BUN: 24 mg/dL — AB (ref 6–20)
CALCIUM: 8.7 mg/dL — AB (ref 8.9–10.3)
CO2: 27 mmol/L (ref 22–32)
Chloride: 103 mmol/L (ref 101–111)
Creatinine, Ser: 1.14 mg/dL — ABNORMAL HIGH (ref 0.44–1.00)
GFR calc Af Amer: 55 mL/min — ABNORMAL LOW (ref >60)
GFR, EST NON AFRICAN AMERICAN: 48 mL/min — AB (ref >60)
GLUCOSE: 89 mg/dL (ref 65–99)
Potassium: 3.9 mmol/L (ref 3.5–5.1)
SODIUM: 137 mmol/L (ref 135–145)

## 2016-06-16 LAB — CBC
HCT: 43.1 % (ref 36.0–46.0)
Hemoglobin: 14.5 g/dL (ref 12.0–15.0)
MCH: 31.2 pg (ref 26.0–34.0)
MCHC: 33.6 g/dL (ref 30.0–36.0)
MCV: 92.7 fL (ref 78.0–100.0)
PLATELETS: 262 10*3/uL (ref 150–400)
RBC: 4.65 MIL/uL (ref 3.87–5.11)
RDW: 13.5 % (ref 11.5–15.5)
WBC: 9.3 10*3/uL (ref 4.0–10.5)

## 2016-06-16 LAB — TROPONIN I: Troponin I: <0.03 ng/mL (ref <0.03)

## 2016-06-16 MED ORDER — LEVOFLOXACIN 750 MG PO TABS: 750.0000 mg | ORAL_TABLET | Freq: Every day | ORAL | 0 refills | Status: AC

## 2016-06-16 MED ORDER — LEVOFLOXACIN 750 MG PO TABS
750.0000 mg | ORAL_TABLET | Freq: Every day | ORAL | Status: DC
  Administered 2016-06-16: 750 mg via ORAL
  Filled 2016-06-16: qty 1

## 2016-06-16 NOTE — Discharge Instructions (Signed)
Please read and follow all provided instructions.  Your diagnoses today include:  1. HCAP (healthcare-associated pneumonia)    Tests performed today include: Blood counts and electrolytes Chest x-ray -- shows pneumonia Vital signs. See below for your results today.   Medications prescribed:   Take any prescribed medications only as directed.  Home care instructions:  Follow any educational materials contained in this packet.  Take the complete course of antibiotics that you were prescribed.   BE VERY CAREFUL not to take multiple medicines containing Tylenol (also called acetaminophen). Doing so can lead to an overdose which can damage your liver and cause liver failure and possibly death.   Follow-up instructions: Please follow-up with your primary care provider in the next 3 days for further evaluation of your symptoms and to ensure resolution of your infection.   Return instructions:  Please return to the Emergency Department if you experience worsening symptoms.  Return immediately with worsening breathing, worsening shortness of breath, or if you feel it is taking you more effort to breathe.  Please return if you have any other emergent concerns.  Additional Information:  Your vital signs today were: BP (!) 170/77 (BP Location: Right Arm)    Pulse (!) 54    Temp 98.6 F (37 C) (Oral)    Resp (!) 22    Ht 5\' 8"  (1.727 m)    Wt 129.7 kg    SpO2 98%    BMI 43.49 kg/m  If your blood pressure (BP) was elevated above 135/85 this visit, please have this repeated by your doctor within one month. --------------

## 2016-06-16 NOTE — ED Notes (Signed)
Incentive spirometer and education provided prior to dc.

## 2016-06-16 NOTE — ED Triage Notes (Signed)
Pt c/o "can't get a deep breath" since last pm; had surg to repair broken ankle on Thurs; denies CP; no apparent distress

## 2016-06-16 NOTE — ED Notes (Signed)
Patient transported to X-ray 

## 2016-06-16 NOTE — ED Provider Notes (Signed)
[facility] DEPT [facility] Provider Note   CSN: [id number] Arrival date & time: 06/16/16  0906     History   Chief Complaint Chief Complaint  Patient presents with  . Shortness of Breath    HPI Whitney Ortiz is a 71 y.o. female.  HPI  71 y.o. female with a hx of Asthma, presents to the Emergency Department today complaining of shortness of breath since last night. Pt sent from UC due to concern for PE. Pt with recent Ankle ORIF on 06-13-16 byDr. Doran Durand. Notes no symptoms until last night when she felt that she could not catch her breath. No associated pain. No CP/ABD pain. No N/V/D. No fevers. Mild cough noted. Non productive. No numbness/tingling. No hx DVT/PE. No blood thinners. No hx ACS. No FH. No other symptoms noted.   Past Medical History:  Diagnosis Date  . Arthritis    neck  . Asthma    daily inhaler  . Dental crowns present    also dental caps  . Family history of adverse reaction to anesthesia    pt's sister has hx. of post-op N/V  . Hypothyroidism   . Osteoarthritis    knees  . PAF (paroxysmal atrial fibrillation) (HCC)    episodic  . Sleep apnea    no CPAP use  . Trimalleolar fracture of ankle, closed 06/2016   left    Patient Active Problem List   Diagnosis Date Noted  . Superficial gastritis 02/14/2015  . Gastrointestinal ulcer due to Helicobacter pylori 35/00/9381  . Combined fat and carbohydrate induced hyperlipemia 02/10/2015  . Paroxysmal atrial fibrillation ([place]) 02/10/2015  . Acute bronchitis 08/25/2014  . Absolute anemia 12/04/2013  . Benign essential HTN 12/04/2013  . Benign neoplasm of colon 12/04/2013  . Clinical depression 12/04/2013  . Generalized OA 12/04/2013  . Osteopenia 12/04/2013  . Primary osteoarthritis of both knees 12/04/2013  . Chronic infection of sinus 12/04/2013  . Apnea, sleep 12/04/2013  . Unspecified arthropathy, lower leg 11/06/2013  . Unspecified hypothyroidism 11/06/2013  . Unspecified constipation 11/06/2013  .  Obese 11/03/2013  . Postoperative anemia due to acute blood loss 11/03/2013  . 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 Allergic rhinitis 09/29/2013  . Adult hypothyroidism 09/29/2013  . Degenerative joint disease involving multiple joints 09/29/2013  . Excess weight 09/29/2013    Past Surgical History:  Procedure Laterality Date  . ABDOMINAL HYSTERECTOMY     complete  . CATARACT EXTRACTION W/ INTRAOCULAR LENS  IMPLANT, BILATERAL    . ORIF ANKLE FRACTURE Left 06/13/2016   Procedure: OPEN REDUCTION INTERNAL FIXATION (ORIF) Trimalleloar ANKLE FRACTURE;  Surgeon: Wylene Simmer, MD;  Location: [facility];  Service: Orthopedics;  Laterality: Left;  . TOTAL KNEE ARTHROPLASTY Bilateral 11/01/2013   Procedure: TOTAL KNEE BILATERAL;  Surgeon: Mauri Pole, MD;  Location: [facility];  Service: Orthopedics;  Laterality: Bilateral;  . TURBINATE REDUCTION      OB History    No data available       Home Medications    Prior to Admission medications   Medication Sig Start Date End Date Taking? Authorizing Provider  aspirin EC 81 MG tablet Take 1 tablet (81 mg total) by mouth 2 (two) times daily. 06/13/16   Wylene Simmer, MD  cholecalciferol (VITAMIN D) 1000 units tablet Take 1,000 Units by mouth daily.    Historical Provider, MD  docusate sodium 100 MG CAPS Take 100 mg by mouth 2 (two) times daily. 11/03/13   Danae Orleans, PA-C  mometasone-formoterol (  DULERA) 200-5 MCG/ACT AERO Inhale 2 puffs into the lungs 2 (two) times daily. 08/25/14   Historical Provider, MD  naproxen sodium (ALEVE) 220 MG tablet Take 2 tablets (440 mg total) by mouth 2 (two) times daily with a meal. 06/13/16 06/16/16  Wylene Simmer, MD  oxyCODONE (ROXICODONE) 5 MG immediate release tablet Take 1-2 tablets (5-10 mg total) by mouth every 4 (four) hours as needed for moderate pain or severe pain. 06/13/16   Wylene Simmer, MD  thyroid (ARMOUR) 60 MG tablet Take 60 mg by mouth daily before breakfast.    Historical Provider, MD  vitamin C  (ASCORBIC ACID) 500 MG tablet Take 500 mg by mouth daily.    Historical Provider, MD    Family History Family History  Problem Relation Age of Onset  . Diabetes Mother   . Hypertension Father   . Anesthesia problems Sister     post-op N/V    Social History Social History  Substance Use Topics  . Smoking status: Never Smoker  . Smokeless tobacco: Never Used  . Alcohol use Yes     Comment: occasionally     Allergies   Penicillins   Review of Systems Review of Systems ROS reviewed and all are negative for acute change except as noted in the HPI.  Physical Exam Updated Vital Signs BP (!) 170/77 (BP Location: Right Arm)   Pulse (!) 54   Temp 98.6 F (37 C) (Oral)   Resp (!) 22   Ht 5\' 8"  (1.727 m)   Wt 129.7 kg   SpO2 98%   BMI 43.49 kg/m   Physical Exam  Constitutional: She is oriented to person, place, and time. Vital signs are normal. She appears well-developed and well-nourished.  NAD. Breathing comfortably.   HENT:  Head: Normocephalic.  Right Ear: Hearing normal.  Left Ear: Hearing normal.  Eyes: Conjunctivae and EOM are normal. Pupils are equal, round, and reactive to light.  Neck: Normal range of motion. Neck supple.  Cardiovascular: Normal rate, regular rhythm, normal heart sounds and intact distal pulses.   Pulmonary/Chest: Effort normal and breath sounds normal. She has no decreased breath sounds. She has no wheezes. She has no rhonchi. She has no rales.  Abdominal: Soft.  Musculoskeletal: Normal range of motion.  Neurological: She is alert and oriented to person, place, and time.  Skin: Skin is warm and dry.  Psychiatric: She has a normal mood and affect. Her speech is normal and behavior is normal. Thought content normal.  Nursing note and vitals reviewed.  ED Treatments / Results  Labs (all labs ordered are listed, but only abnormal results are displayed) Labs Reviewed  BASIC METABOLIC PANEL - Abnormal; Notable for the following:       Result  Value   BUN 24 (*)    Creatinine, Ser 1.14 (*)    Calcium 8.7 (*)    GFR calc non Af Amer 48 (*)    GFR calc Af Amer 55 (*)    All other components within normal limits  TROPONIN I  CBC    EKG  EKG Interpretation  Date/Time:  Sunday June 16 2016 09:21:13 EDT Ventricular Rate:  67 PR Interval:    QRS Duration: 98 QT Interval:  409 QTC Calculation: 432 R Axis:   46 Text Interpretation:  Sinus rhythm Baseline wander in lead(s) V3 Confirmed by [place] [phone]) on 06/16/2016 9:41:56 AM       Radiology Dg Chest 2 View  Result Date: 06/16/2016  CLINICAL DATA:  Shortness of breath. EXAM: CHEST  2 VIEW COMPARISON:  December 06, 2015 FINDINGS: There is elevation of the right hemidiaphragm which is more prominent when compared to 2017. The heart, hila, and mediastinum are normal. No pneumothorax. Mild streaky opacity in the left base. Degenerative changes of the first costochondral junctions. A small nodular appearance projected over the left first costochondral junction on 2 images may be part of the degenerative changes. On the second image, this finding is symmetric to the right. No other acute abnormalities. IMPRESSION: 1. Increasing elevation of the right hemidiaphragm compared to the previous study. 2. Mild streaky opacity in the left base may represent atelectasis or early infiltrate. 3. Small nodular appearance over the left first costal chondral junction is nonspecific but may be associated with degenerative change at the junction. A CT scan could exclude an underlying nodule. Electronically Signed   By: Dorise Bullion III M.D   On: 06/16/2016 10:11    Procedures Procedures (including critical care time)  Medications Ordered in ED Medications - No data to display   Initial Impression / Assessment and Plan / ED Course  I have reviewed the triage vital signs and the nursing notes.  Pertinent labs & imaging results that were available during my care of the patient were  reviewed by me and considered in my medical decision making (see chart for details).  Final Clinical Impressions(s) / ED Diagnoses  {I have reviewed and evaluated the relevant laboratory values. {I have reviewed and evaluated the relevant imaging studies. {I have interpreted the relevant EKG. {I have reviewed the relevant previous healthcare records.  {I obtained HPI from historian. {Patient discussed with supervising physician.  ED Course:  Assessment: Pt is a 71 y.o. female with hx Asthma who presents with shortness of breath since night. Sent by UC with concern for PE. Recent Ankle ORIF on left on 06-13-16. No pain currently. NO pleuritic pain. No N/V. No diaphoresis. Mild cough. No fever. On exam, pt in NAD. Nontoxic/nonseptic appearing. VSS. Afebrile. Lungs CTA. Heart RRR. Abdomen nontender soft. CBC with no leukocytosis. BMP unremarkable. Trop negative. EKG unremarkable. CXR with possible infiltrate vs atelectasis left base. Plan is to DC home with ABX. Likely atelectasis, but will cover for PNA with Levaquin. Afebrile. No leukocytosis. Doubt PE given presentation. Discussed with attending physician who agrees. Strict return precautions given. Close follow up to PCP. At time of discharge, Patient is in no acute distress. Vital Signs are stable. Patient is able to ambulate. Patient able to tolerate PO.   Disposition/Plan:  DC Home Additional Verbal discharge instructions given and discussed with patient.  Pt Instructed to f/u with PCP in the next week for evaluation and treatment of symptoms. Return precautions given Pt acknowledges and agrees with plan  Supervising Physician Virgel Manifold, MD  Final diagnoses:  HCAP (healthcare-associated pneumonia)    New Prescriptions New Prescriptions   No medications on file     Shary Decamp, PA-C 06/16/16 [place], MD 06/16/16 1336

## 2016-07-17 ENCOUNTER — Ambulatory Visit (INDEPENDENT_AMBULATORY_CARE_PROVIDER_SITE_OTHER): Payer: Medicare Other | Admitting: Pulmonary Disease

## 2016-07-17 ENCOUNTER — Encounter: Payer: Self-pay | Admitting: Pulmonary Disease

## 2016-07-17 VITALS — BP 154/84 | HR 58

## 2016-07-17 DIAGNOSIS — J45909 Unspecified asthma, uncomplicated: Secondary | ICD-10-CM | POA: Diagnosis not present

## 2016-07-17 DIAGNOSIS — J986 Disorders of diaphragm: Secondary | ICD-10-CM

## 2016-07-17 DIAGNOSIS — J40 Bronchitis, not specified as acute or chronic: Secondary | ICD-10-CM

## 2016-07-17 DIAGNOSIS — J329 Chronic sinusitis, unspecified: Secondary | ICD-10-CM

## 2016-07-17 DIAGNOSIS — J4540 Moderate persistent asthma, uncomplicated: Secondary | ICD-10-CM | POA: Diagnosis not present

## 2016-07-17 LAB — NITRIC OXIDE: Nitric Oxide: 36

## 2016-07-17 NOTE — Progress Notes (Signed)
Subjective:    Patient ID: Whitney Ortiz, female    DOB: 03/23/1945, 71 y.o.   MRN: [id number]  HPI Chief Complaint  Patient presents with  . Advice Only    self referral for recurrent bronchitis.     Whitney Ortiz is here to see me because over the years she has had repeated episodes of bronchitis.  She says she got pneumonia after her recent ankle surgery and was treated with antibiotics.  She believes she has had pneumonia twice int he last year.   > the last bad spell of bronchitis was in the fall of last year and she had symptoms for 6 weeks > this occurred after exposure to smoke > the most severe episodes have occurred since this episode in 2017  She has been told that she has asthma as well and has been on an inhaler but she only takes it sporadically.  She was told that she had COPD by her PCP at one point. > she was diagnosed with asthma around age 65 > she takes Advair sporadically only > her symptoms wax and wane, no clear triggers > she says that whenever she gets a cold it makes it worse > pollen and viral infections will make her have shortness of breath (harder breathing in), some cough, some wheeze > no childhood lung problems > though she was told she had the lungs of a smoker > born full term > sister has had asthma > she has a history of recurrent sinus infections  She has more dyspnea since 2017 as well.  She attributes this to poor fitness.  She has not gained weight recently.  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   She moved in June 2017 to a house built in 1986.  No mold mildew or dust in the house. No new problems.     She has had a lot of neck pain over the years for which she takes NSAIDs.  No hand numbness or weakness.   No recent major trauma like a fall, no MVA, no chest wall injury.    She has not used Advair in the last week.  She uses albuterol nightly and as needed 2-3 times per week.    Lifelong non-smoker.  No touble swallowing, no  coughing with meals.  No clear dysphagia.     Past Medical History:  Diagnosis Date  . Arthritis    neck  . Asthma    daily inhaler  . Dental crowns present    also dental caps  . Family history of adverse reaction to anesthesia    pt's sister has hx. of post-op N/V  . Hypothyroidism   . Osteoarthritis    knees  . PAF (paroxysmal atrial fibrillation) (HCC)    episodic  . Sleep apnea    no CPAP use  . Trimalleolar fracture of ankle, closed 06/2016   left     Family History  Problem Relation Age of Onset  . Diabetes Mother   . Hypertension Father   . Anesthesia problems Sister     post-op N/V  . Asthma Sister      Social History   Social History  . Marital status: Married    Spouse name: N/A  . Number of children: N/A  . Years of education: N/A   Occupational History  . Not on file.   Social History Main Topics  . Smoking status: Never Smoker  . Smokeless tobacco: Never Used  .  Alcohol use Yes     Comment: occasionally  . Drug use: No  . Sexual activity: Not on file   Other Topics Concern  . Not on file   Social History Narrative  . No narrative on file     Allergies  Allergen Reactions  . Penicillins Rash     Outpatient Medications Prior to Visit  Medication Sig Dispense Refill  . aspirin EC 81 MG tablet Take 1 tablet (81 mg total) by mouth 2 (two) times daily.    . cholecalciferol (VITAMIN D) 1000 units tablet Take 1,000 Units by mouth daily.    Marland Kitchen docusate sodium 100 MG CAPS Take 100 mg by mouth 2 (two) times daily. 10 capsule 0  . mometasone-formoterol (DULERA) 200-5 MCG/ACT AERO Inhale 2 puffs into the lungs 2 (two) times daily.    . vitamin C (ASCORBIC ACID) 500 MG tablet Take 500 mg by mouth daily.    Marland Kitchen oxyCODONE (ROXICODONE) 5 MG immediate release tablet Take 1-2 tablets (5-10 mg total) by mouth every 4 (four) hours as needed for moderate pain or severe pain. (Patient not taking: Reported on 07/17/2016) 20 tablet 0  . thyroid (ARMOUR) 60 MG  tablet Take 60 mg by mouth daily before breakfast.     No facility-administered medications prior to visit.       Review of Systems  Constitutional: Negative for fever and unexpected weight change.  HENT: Negative for congestion, dental problem, ear pain, nosebleeds, postnasal drip, rhinorrhea, sinus pressure, sneezing, sore throat and trouble swallowing.   Eyes: Negative for redness and itching.  Respiratory: Positive for shortness of breath. Negative for cough, chest tightness and wheezing.   Cardiovascular: Negative for palpitations and leg swelling.  Gastrointestinal: Negative for nausea and vomiting.  Genitourinary: Negative for dysuria.  Musculoskeletal: Negative for joint swelling.  Skin: Negative for rash.  Neurological: Negative for headaches.  Hematological: Does not bruise/bleed easily.  Psychiatric/Behavioral: Negative for dysphoric mood. The patient is not nervous/anxious.        Objective:   Physical Exam  Vitals:   07/17/16 1615  BP: (!) 154/84  Pulse: (!) 58  SpO2: 95%    Gen: obese, chronically ill appearing, no acute distress HENT: NCAT, OP clear, neck supple without masses Eyes: PERRL, EOMi Lymph: no cervical lymphadenopathy PULM: diminished airflow R base, clear left, no wheezing CV: RRR, no mgr, no JVD GI: BS+, soft, nontender, no hsm Derm: no rash or skin breakdown MSK: normal bulk and tone Neuro: A&Ox4, CN II-XII intact, strength 5/5 in all 4 extremities Psyche: normal mood and affect   Hospital records reviewed from April 2018 where she had a left ankle surgical repair.  April 2018 chest x-ray showed increasing elevation of the right hemidiaphragm and streaky opacity in the left base which likely represents atelectasis. Questionable left first costal chondral junction abnormality.  FENO 07/2016 36 ppm on no inhaled steroid     Assessment & Plan:  Elevated hemidiaphragm This was noticed on her most recent chest x-ray. This can certainly  make her more susceptible to respiratory infections as it can contribute to decreased mucociliary clearance. The etiology is uncertain. I wonder about cervical spine compression considering her chronic neck pain. The differential diagnosis is broad and includes post viral, idiopathic, chest malignancy.  Plan: Diaphragm fluoroscopy If fluoroscopy is abnormal then obtain CT scan of the chest  Chronic sinusitis with recurrent bronchitis She has had multiple episodes of recurrent bronchitis. Unexplained her today that this may be due to  her diaphragm being weak making it difficult for her to clear respiratory secretions, less likely dysphagia with recurrent aspiration (she has no symptoms of this) or uncontrolled asthma.  There are other possible causes like bronchiectasis but her physical exam and chest imaging or not suggestive of this.  Plan: Evaluate for asthma with exhaled nitric oxide testing and pulmonary function testing Take asthma treatment regularly: Advair twice a day not as needed Workup for diaphragm weakness, see above  Asthma Exhaled nitric oxide testing today was 36 ppm. This is elevated and suggestive of eosinophilic airway inflammation. Certainly this can go along with asthma and explained chronic recurrent bronchitis. Because of this I have asked her to take Advair twice a day. We will follow-up lung function testing and the rest of her workup and determine if she needs to continue just taking Advair or if we can step down therapy to an inhaled corticosteroid alone.    Current Outpatient Prescriptions:  .  aspirin EC 81 MG tablet, Take 1 tablet (81 mg total) by mouth 2 (two) times daily., Disp: , Rfl:  .  cholecalciferol (VITAMIN D) 1000 units tablet, Take 1,000 Units by mouth daily., Disp: , Rfl:  .  docusate sodium 100 MG CAPS, Take 100 mg by mouth 2 (two) times daily., Disp: 10 capsule, Rfl: 0 .  levothyroxine (SYNTHROID, LEVOTHROID) 50 MCG tablet, Take 50 mcg by mouth daily  before breakfast., Disp: , Rfl:  .  mometasone-formoterol (DULERA) 200-5 MCG/ACT AERO, Inhale 2 puffs into the lungs 2 (two) times daily., Disp: , Rfl:  .  piroxicam (FELDENE) 20 MG capsule, Take 20 mg by mouth daily., Disp: , Rfl:  .  vitamin C (ASCORBIC ACID) 500 MG tablet, Take 500 mg by mouth daily., Disp: , Rfl:

## 2016-07-17 NOTE — Assessment & Plan Note (Signed)
Exhaled nitric oxide testing today was 36 ppm. This is elevated and suggestive of eosinophilic airway inflammation. Certainly this can go along with asthma and explained chronic recurrent bronchitis. Because of this I have asked her to take Advair twice a day. We will follow-up lung function testing and the rest of her workup and determine if she needs to continue just taking Advair or if we can step down therapy to an inhaled corticosteroid alone.

## 2016-07-17 NOTE — Patient Instructions (Signed)
We will arrange for a test to assess your diaphragm muscle strength Take Advair twice a day no matter what We will arrange for a lung function test We will see you back in 2-3 weeks or sooner if needed

## 2016-07-17 NOTE — Assessment & Plan Note (Signed)
This was noticed on her most recent chest x-ray. This can certainly make her more susceptible to respiratory infections as it can contribute to decreased mucociliary clearance. The etiology is uncertain. I wonder about cervical spine compression considering her chronic neck pain. The differential diagnosis is broad and includes post viral, idiopathic, chest malignancy.  Plan: Diaphragm fluoroscopy If fluoroscopy is abnormal then obtain CT scan of the chest

## 2016-07-17 NOTE — Assessment & Plan Note (Signed)
She has had multiple episodes of recurrent bronchitis. Unexplained her today that this may be due to her diaphragm being weak making it difficult for her to clear respiratory secretions, less likely dysphagia with recurrent aspiration (she has no symptoms of this) or uncontrolled asthma.  There are other possible causes like bronchiectasis but her physical exam and chest imaging or not suggestive of this.  Plan: Evaluate for asthma with exhaled nitric oxide testing and pulmonary function testing Take asthma treatment regularly: Advair twice a day not as needed Workup for diaphragm weakness, see above

## 2016-07-24 ENCOUNTER — Ambulatory Visit (HOSPITAL_COMMUNITY)
Admission: RE | Admit: 2016-07-24 | Discharge: 2016-07-24 | Disposition: A | Discharge status: Home / Self Care | Payer: Medicare Other | Source: Ambulatory Visit | Attending: Pulmonary Disease | Admitting: Pulmonary Disease

## 2016-07-24 DIAGNOSIS — J986 Disorders of diaphragm: Secondary | ICD-10-CM | POA: Diagnosis present

## 2016-08-06 ENCOUNTER — Encounter: Payer: Self-pay | Admitting: Adult Health

## 2016-08-06 ENCOUNTER — Ambulatory Visit (INDEPENDENT_AMBULATORY_CARE_PROVIDER_SITE_OTHER): Payer: Medicare Other | Admitting: Adult Health

## 2016-08-06 ENCOUNTER — Ambulatory Visit (INDEPENDENT_AMBULATORY_CARE_PROVIDER_SITE_OTHER): Payer: Medicare Other | Admitting: Pulmonary Disease

## 2016-08-06 VITALS — BP 118/78 | HR 63 | Resp 16 | Ht 68.0 in | Wt 300.6 lb

## 2016-08-06 DIAGNOSIS — J4540 Moderate persistent asthma, uncomplicated: Secondary | ICD-10-CM | POA: Diagnosis not present

## 2016-08-06 DIAGNOSIS — J986 Disorders of diaphragm: Secondary | ICD-10-CM

## 2016-08-06 DIAGNOSIS — J42 Unspecified chronic bronchitis: Secondary | ICD-10-CM | POA: Diagnosis not present

## 2016-08-06 DIAGNOSIS — J45909 Unspecified asthma, uncomplicated: Secondary | ICD-10-CM

## 2016-08-06 DIAGNOSIS — R05 Cough: Secondary | ICD-10-CM | POA: Diagnosis not present

## 2016-08-06 DIAGNOSIS — R053 Chronic cough: Secondary | ICD-10-CM

## 2016-08-06 LAB — PULMONARY FUNCTION TEST
DL/VA % pred: 69 %
DL/VA: 3.66 ml/min/mmHg/L
DLCO COR: 18.09 ml/min/mmHg
DLCO UNC % PRED: 63 %
DLCO UNC: 18.97 ml/min/mmHg
DLCO cor % pred: 61 %
FEF 25-75 PRE: 1.39 L/s
FEF 25-75 Post: 1.37 L/sec
FEF2575-%Change-Post: -1 %
FEF2575-%PRED-POST: 64 %
FEF2575-%PRED-PRE: 65 %
FEV1-%Change-Post: 0 %
FEV1-%PRED-POST: 76 %
FEV1-%PRED-PRE: 77 %
FEV1-PRE: 2.03 L
FEV1-Post: 2.03 L
FEV1FVC-%CHANGE-POST: 2 %
FEV1FVC-%PRED-PRE: 93 %
FEV6-%CHANGE-POST: -2 %
FEV6-%PRED-POST: 83 %
FEV6-%Pred-Pre: 85 %
FEV6-PRE: 2.83 L
FEV6-Post: 2.77 L
FEV6FVC-%CHANGE-POST: 0 %
FEV6FVC-%PRED-PRE: 103 %
FEV6FVC-%Pred-Post: 103 %
FVC-%Change-Post: -2 %
FVC-%Pred-Post: 79 %
FVC-%Pred-Pre: 82 %
FVC-Post: 2.78 L
FVC-Pre: 2.86 L
PRE FEV1/FVC RATIO: 71 %
Post FEV1/FVC ratio: 73 %
Post FEV6/FVC ratio: 100 %
Pre FEV6/FVC Ratio: 99 %
RV % PRED: 97 %
RV: 2.33 L
TLC % pred: 95 %
TLC: 5.37 L

## 2016-08-06 MED ORDER — FLUTICASONE-SALMETEROL 100-50 MCG/DOSE IN AEPB: 1.0000 | INHALATION_SPRAY | Freq: Two times a day (BID) | RESPIRATORY_TRACT | 0 refills | Status: DC

## 2016-08-06 NOTE — Progress Notes (Signed)
PFT done today. 

## 2016-08-06 NOTE — Progress Notes (Signed)
@Patient  ID: Whitney Ortiz, female    DOB: 19-Feb-1946, 71 y.o.   MRN: [id number]  Chief Complaint  Patient presents with  . Follow-up    Asthma     Referring provider: Maylon Peppers, MD  HPI: 71 year old female never smoker seen for pulmonary consult 07/17/2016 for recurrent bronchitis and pneumonia.  TEST  07/17/16 FENO 36  08/06/2016 Follow up : Asthma /Chronic bronchitis  Patient returns for two-week follow-up. Patient was seen last visit for pulmonary consult for recurrent bronchitis, asthma and recurrent pneumonia. Patient had been told in the past that she had asthma and COPD. Patient is a never smoker. She was prescribed Advair but was not taking on a regular basis. Chest x-ray had showed a right elevated hemidiaphragm with mild opacity at the left base Patient was set up for a sniff test that showed movement of the right hemidiaphragm but limited motion. Pulmonary function test done today showed an FEV1 at 77%, ratio 71, FVC 82%. Total lung capacity 95%, DLCO 63% , no significant bronchodilator response. Patient says that she has been feeling better. She still has not taken her Advair on a regular basis.   Allergies  Allergen Reactions  . Penicillins Rash    Immunization History  Administered Date(s) Administered  . Influenza Split 01/18/2016  . Pneumococcal Conjugate-13 07/18/2015  . Pneumococcal Polysaccharide-23 07/17/2013    Past Medical History:  Diagnosis Date  . Arthritis    neck  . Asthma    daily inhaler  . Dental crowns present    also dental caps  . Family history of adverse reaction to anesthesia    pt's sister has hx. of post-op N/V  . Hypothyroidism   . Osteoarthritis    knees  . PAF (paroxysmal atrial fibrillation) (HCC)    episodic  . Sleep apnea    no CPAP use  . Trimalleolar fracture of ankle, closed 06/2016   left    Tobacco History: History  Smoking Status  . Never Smoker  Smokeless Tobacco  . Never Used   Counseling given:  Not Answered   Outpatient Encounter Prescriptions as of 08/06/2016  Medication Sig  . aspirin EC 81 MG tablet Take 1 tablet (81 mg total) by mouth 2 (two) times daily.  . cholecalciferol (VITAMIN D) 1000 units tablet Take 1,000 Units by mouth daily.  Marland Kitchen docusate sodium 100 MG CAPS Take 100 mg by mouth 2 (two) times daily.  Marland Kitchen levothyroxine (SYNTHROID, LEVOTHROID) 50 MCG tablet Take 50 mcg by mouth daily before breakfast.  . piroxicam (FELDENE) 20 MG capsule Take 20 mg by mouth daily.  . vitamin C (ASCORBIC ACID) 500 MG tablet Take 500 mg by mouth daily.  . Fluticasone-Salmeterol (ADVAIR DISKUS) 100-50 MCG/DOSE AEPB Inhale 1 puff into the lungs 2 (two) times daily.  . [DISCONTINUED] mometasone-formoterol (DULERA) 200-5 MCG/ACT AERO Inhale 2 puffs into the lungs 2 (two) times daily.   No facility-administered encounter medications on file as of 08/06/2016.      Review of Systems  Constitutional:   No  weight loss, night sweats,  Fevers, chills, fatigue, or  lassitude.  HEENT:   No headaches,  Difficulty swallowing,  Tooth/dental problems, or  Sore throat,                No sneezing, itching, ear ache, nasal congestion, post nasal drip,   CV:  No chest pain,  Orthopnea, PND, swelling in lower extremities, anasarca, dizziness, palpitations, syncope.   GI  No heartburn, indigestion,  abdominal pain, nausea, vomiting, diarrhea, change in bowel habits, loss of appetite, bloody stools.   Resp:    No excess mucus, no productive cough,  No non-productive cough,  No coughing up of blood.  No change in color of mucus.  No wheezing.  No chest wall deformity  Skin: no rash or lesions.  GU: no dysuria, change in color of urine, no urgency or frequency.  No flank pain, no hematuria   MS:  No joint pain or swelling.  No decreased range of motion.  No back pain.    Physical Exam  BP 118/78 (BP Location: Left Arm, Cuff Size: Large)   Pulse 63   Resp 16   Ht 5\' 8"  (1.727 m)   Wt (!) 300 lb 9.6 oz  (136.4 kg)   SpO2 96%   BMI 45.71 kg/m   GEN: A/Ox3; pleasant , NAD, elderly obese in walking boot    HEENT:  [place]/AT,  EACs-clear, TMs-wnl, NOSE-clear, THROAT-clear, no lesions, no postnasal drip or exudate noted.   NECK:  Supple w/ fair ROM; no JVD; normal carotid impulses w/o bruits; no thyromegaly or nodules palpated; no lymphadenopathy.    RESP  Clear  P & A; w/o, wheezes/ rales/ or rhonchi. no accessory muscle use, no dullness to percussion  CARD:  RRR, no m/r/g, no peripheral edema, pulses intact, no cyanosis or clubbing.  GI:   Soft & nt; nml bowel sounds; no organomegaly or masses detected.   Musco: Warm bil, no deformities or joint swelling noted.  Left walking boot.   Neuro: alert, no focal deficits noted.    Skin: Warm, no lesions or rashes    Lab Results:  CBC    Component Value Date/Time   WBC 9.3 06/16/2016 1009   RBC 4.65 06/16/2016 1009   HGB 14.5 06/16/2016 1009   HCT 43.1 06/16/2016 1009   PLT 262 06/16/2016 1009   MCV 92.7 06/16/2016 1009   MCH 31.2 06/16/2016 1009   MCHC 33.6 06/16/2016 1009   RDW 13.5 06/16/2016 1009    BMET    Component Value Date/Time   NA 137 06/16/2016 1009   K 3.9 06/16/2016 1009   CL 103 06/16/2016 1009   CO2 27 06/16/2016 1009   GLUCOSE 89 06/16/2016 1009   BUN 24 (H) 06/16/2016 1009   CREATININE 1.14 (H) 06/16/2016 1009   CALCIUM 8.7 (L) 06/16/2016 1009   GFRNONAA 48 (L) 06/16/2016 1009   GFRAA 55 (L) 06/16/2016 1009    BNP No results found for: BNP  ProBNP No results found for: PROBNP  Imaging: Dg Sniff Test  Result Date: 07/24/2016 CLINICAL DATA:  Elevated right hemidiaphragm. EXAM: CHEST FLUOROSCOPY TECHNIQUE: Real-time fluoroscopic evaluation of the chest was performed. FLUOROSCOPY TIME:  Fluoroscopy Time:  0 minutes 36 seconds. Radiation Exposure Index (if provided by the fluoroscopic device): 4 mGy Number of Acquired Spot Images: 0 COMPARISON:  Chest radiograph 06/16/2016 and 12/06/2015. FINDINGS:  Right hemidiaphragm is elevated. The right hemidiaphragm does have normal downward motion with inspiration but motion is limited. IMPRESSION: Limited downward motion of the right hemidiaphragm with inspiration. Electronically Signed   By: Lorin Picket M.D.   On: 07/24/2016 14:18     Assessment & Plan:   Elevated hemidiaphragm Right elevated hemidiaphragm ? Etiology  Sniff test does show motion but limited.  Cont to follow  CT chest ordered.    Asthma Asthma with frequent bronchitis  PFT shows preserved lung fxn w/out obstruction .  FENO testing was elevated  c/w asthma .  DLCO was decreased with CXR abn will set up for HRCT chest to make sure bronchiectasis /ILD changes are not present .   Plan  Patient Instructions  Should take Advair 1 puff Twice daily  , rinse after use. Do not miss doses.  Set up CT chest .  Follow up with Dr. Lake Bells in 2 months and As needed   Please contact office for sooner follow up if symptoms do not improve or worsen or seek emergency care         Rexene Edison, NP 08/06/2016

## 2016-08-06 NOTE — Assessment & Plan Note (Signed)
Right elevated hemidiaphragm ? Etiology  Sniff test does show motion but limited.  Cont to follow  CT chest ordered.

## 2016-08-06 NOTE — Assessment & Plan Note (Signed)
Asthma with frequent bronchitis  PFT shows preserved lung fxn w/out obstruction .  FENO testing was elevated c/w asthma .  DLCO was decreased with CXR abn will set up for HRCT chest to make sure bronchiectasis /ILD changes are not present .   Plan  Patient Instructions  Should take Advair 1 puff Twice daily  , rinse after use. Do not miss doses.  Set up CT chest .  Follow up with Dr. Lake Bells in 2 months and As needed   Please contact office for sooner follow up if symptoms do not improve or worsen or seek emergency care

## 2016-08-06 NOTE — Patient Instructions (Addendum)
Should take Advair 1 puff Twice daily  , rinse after use. Do not miss doses.  Set up CT chest .  Follow up with Dr. Lake Bells in 2 months and As needed   Please contact office for sooner follow up if symptoms do not improve or worsen or seek emergency care

## 2016-11-06 ENCOUNTER — Ambulatory Visit (INDEPENDENT_AMBULATORY_CARE_PROVIDER_SITE_OTHER)
Admission: RE | Admit: 2016-11-06 | Discharge: 2016-11-06 | Disposition: A | Discharge status: Home / Self Care | Payer: Medicare Other | Source: Ambulatory Visit | Attending: Adult Health | Admitting: Adult Health

## 2016-11-06 DIAGNOSIS — R05 Cough: Secondary | ICD-10-CM

## 2016-11-06 DIAGNOSIS — R053 Chronic cough: Secondary | ICD-10-CM

## 2016-11-18 ENCOUNTER — Ambulatory Visit: Payer: Medicare Other | Admitting: Pulmonary Disease

## 2016-11-21 ENCOUNTER — Ambulatory Visit (INDEPENDENT_AMBULATORY_CARE_PROVIDER_SITE_OTHER): Payer: Medicare Other | Admitting: Pulmonary Disease

## 2016-11-21 ENCOUNTER — Encounter: Payer: Self-pay | Admitting: Pulmonary Disease

## 2016-11-21 VITALS — BP 138/78 | HR 65 | Ht 68.0 in | Wt 297.0 lb

## 2016-11-21 DIAGNOSIS — J4530 Mild persistent asthma, uncomplicated: Secondary | ICD-10-CM | POA: Diagnosis not present

## 2016-11-21 DIAGNOSIS — J40 Bronchitis, not specified as acute or chronic: Secondary | ICD-10-CM | POA: Diagnosis not present

## 2016-11-21 DIAGNOSIS — J986 Disorders of diaphragm: Secondary | ICD-10-CM | POA: Diagnosis not present

## 2016-11-21 DIAGNOSIS — J329 Chronic sinusitis, unspecified: Secondary | ICD-10-CM

## 2016-11-21 NOTE — Progress Notes (Signed)
Subjective:    Patient ID: Whitney Ortiz, female    DOB: [birth year], 71 y.o.   MRN: [id number]  Synopsis: Referred in May 2018 for evaluation of shortness of breath. 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  HPI Chief Complaint  Patient presents with  . Follow-up    CT scan follow up   Whitney Ortiz says that she has been doing fine.  She says that last night she needed to use her albuterol inhaler some.  She doesn't feel sick like she has bronchitis or anything, but she will have some occassional chest tightness from time to time.  She is compliant with her Advair.  She thinks that it has perhaps helped.  She has some cough from time to time.  It's not too bad.    She had a flu shot.    Last year at this time she was sick for three months or more.    Past Medical History:  Diagnosis Date  . Arthritis    neck  . Asthma    daily inhaler  . Dental crowns present    also dental caps  . Family history of adverse reaction to anesthesia    pt's sister has hx. of post-op N/V  . Hypothyroidism   . Osteoarthritis    knees  . PAF (paroxysmal atrial fibrillation) (HCC)    episodic  . Sleep apnea    no CPAP use  . Trimalleolar fracture of ankle, closed 06/2016   left      Review of Systems     Objective:   Physical Exam Vitals:   11/21/16 1452  BP: 138/78  Pulse: 65  SpO2: 95%  Weight: 297 lb (134.7 kg)  Height: 5\' 8"  (1.727 m)    Gen: well appearing HENT: OP clear, TM's clear, neck supple PULM: CTA B, normal percussion CV: RRR, no mgr, trace edema GI: BS+, soft, nontender Derm: no cyanosis or rash Psyche: normal mood and affect   Chest imaging: August 2018 high-resolution CT chest images independently reviewed showing a normal pulmonary parenchyma with the exception of a few pulmonary parenchymal bands consistent with scarring, there were 2 mm nodules, hepatic steatosis   Pulmonary function testing:  May 2018:  Ratio 71%, FEV1 2.03 L 76% predicted, FVC 2.78 L 79% predicted, total lung capacity 5.37 L 95% predicted, DLCO 18.97 63% predicted      Assessment & Plan:   Mild persistent asthma, unspecified whether complicated  Elevated hemidiaphragm  Chronic sinusitis with recurrent bronchitis  Discussion: This is been a stable interval for [employer]. She has not had an exacerbation or bronchitis since we put her on Advair. It has not made much of a difference and shortness of breath but I think that it has been helpful overall. She has mild persistent asthma exacerbated by chronic sinusitis. The CT scan of her chest showed no evidence of an underlying interstitial lung disease or bronchiectasis which is good.  If things are stable after a year of taking Advair than we can either stop Advair altogether or decreased until corticosteroid alone.  Plan: For your mild persistent asthma: Continue taking Advair twice a day Use albuterol as needed  We will see you back in one year or sooner if needed    Current Outpatient Prescriptions:  .  aspirin EC 81 MG tablet, Take 1 tablet (81 mg total) by mouth 2 (two) times daily., Disp: , Rfl:  .  cholecalciferol (VITAMIN D) 1000 units  tablet, Take 1,000 Units by mouth daily., Disp: , Rfl:  .  Fluticasone-Salmeterol (ADVAIR DISKUS) 100-50 MCG/DOSE AEPB, Inhale 1 puff into the lungs 2 (two) times daily., Disp: 14 each, Rfl: 0 .  levothyroxine (SYNTHROID, LEVOTHROID) 50 MCG tablet, Take 50 mcg by mouth daily before breakfast., Disp: , Rfl:  .  piroxicam (FELDENE) 20 MG capsule, Take 20 mg by mouth daily., Disp: , Rfl:

## 2016-11-21 NOTE — Patient Instructions (Signed)
For your mild persistent asthma: Continue taking Advair twice a day Use albuterol as needed  We will see you back in one year or sooner if needed

## 2019-02-16 ENCOUNTER — Telehealth (INDEPENDENT_AMBULATORY_CARE_PROVIDER_SITE_OTHER): Payer: Medicare Other | Admitting: Adult Health

## 2019-02-16 ENCOUNTER — Encounter: Payer: Self-pay | Admitting: Adult Health

## 2019-02-16 ENCOUNTER — Other Ambulatory Visit: Payer: Self-pay

## 2019-02-16 ENCOUNTER — Ambulatory Visit: Payer: Medicare Other

## 2019-02-16 ENCOUNTER — Ambulatory Visit: Payer: Medicare Other | Admitting: Pulmonary Disease

## 2019-02-16 DIAGNOSIS — U071 COVID-19: Secondary | ICD-10-CM

## 2019-02-16 DIAGNOSIS — J4530 Mild persistent asthma, uncomplicated: Secondary | ICD-10-CM | POA: Diagnosis not present

## 2019-02-16 NOTE — Progress Notes (Signed)
Virtual Visit via Video Note  I connected with Whitney Ortiz on 02/16/19 at  2:00 PM EST by a video enabled telemedicine application and verified that I am speaking with the correct person using two identifiers.  Location: Patient: Home  Provider: Office    I discussed the limitations of evaluation and management by telemedicine and the availability of in person appointments. The patient expressed understanding and agreed to proceed.  History of Present Illness: 73 year old female never smoker seen for pulmonary consult Jul 17, 2016 for recurrent bronchitis and pneumonia.  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  Today's televisit is a acute office visit for ongoing cough since development of COVID-19 infection.  Patient was last seen in our office September 2018 .  Was diagnosed with COVID 19 on 01/18/19 , developed with cold like symptoms with cough, congestion , fatigue, body aches, fever (101.5) . She treated herself at home conservatively. Has been recovering at home , cough is improving . Remains fatigued and low energy, but seems to be improving each day.  Taste is not as good but has some and no loss of smell.  Feels that each day is slowly getting better. Has been checking Oxygen levels at home they have been good >92-96% . Some dyspnea with heavy activity but getting better.  No chest pain, orthopnea, edema , hemoptysis, n/v/d. No fevers.   Patient has mild persistent asthma says overall has been doing well over the last couple years..  She remains on Symbicort twice daily.  No increased wheezing.   Observations/Objective:  02/16/2019 patient appears comfortable with no apparent distress.  Talking in full sentences no accessory muscle use noted.  FENO 38 07/2016   August 2018 high-resolution CT chest images independently reviewed showing a normal pulmonary parenchyma with the exception of a few pulmonary parenchymal  bands consistent with scarring, there were 2 mm nodules, hepatic steatosis   Pulmonary function testing:  May 2018: Ratio 71%, FEV1 2.03 L 76% predicted, FVC 2.78 L 79% predicted, total lung capacity 5.37 L 95% predicted, DLCO 18.97 63% predicted  Assessment and Plan: COVID-19 infection-she seems to be slowly improving.  Patient is concerned as she has had pneumonia in the past.  Wants to have a chest x-ray to make sure she did not develop any pneumonia.  I think that is reasonable.  We have set her up for a chest x-ray. She is continue on conservative treatment.  Push fluids.  Activity as tolerated.  Healthy diet with high-protein.  Asthma well controlled on current regimen.  Does not appear to be flared.  Plan  Patient Instructions  Fluids and rest Advance activity as tolerated Continue on Symbicort 2 puffs twice daily, rinse after use Albuterol as needed for wheezing Return for chest x-ray Follow-up in 4 to 6 months with Dr. Elsworth Soho and As needed   Please contact office for sooner follow up if symptoms do not improve or worsen or seek emergency care       Follow Up Instructions: Follow-up in 4 to 6 months with Dr. Elsworth Soho and as needed   I discussed the assessment and treatment plan with the patient. The patient was provided an opportunity to ask questions and all were answered. The patient agreed with the plan and demonstrated an understanding of the instructions.   The patient was advised to call back or seek an in-person evaluation if the symptoms worsen or if the condition fails to  improve as anticipated.  I provided 22 minutes of non-face-to-face time during this encounter.   Rexene Edison, NP

## 2019-02-16 NOTE — Addendum Note (Signed)
Addended by: Parke Poisson E on: 02/16/2019 04:16 PM   Modules accepted: Orders

## 2019-02-16 NOTE — Patient Instructions (Signed)
Fluids and rest Advance activity as tolerated Continue on Symbicort 2 puffs twice daily, rinse after use Albuterol as needed for wheezing Return for chest x-ray Follow-up in 4 to 6 months with Dr. Elsworth Soho and As needed   Please contact office for sooner follow up if symptoms do not improve or worsen or seek emergency care

## 2019-02-23 ENCOUNTER — Ambulatory Visit (INDEPENDENT_AMBULATORY_CARE_PROVIDER_SITE_OTHER): Payer: Medicare Other

## 2019-02-23 DIAGNOSIS — J4530 Mild persistent asthma, uncomplicated: Secondary | ICD-10-CM | POA: Diagnosis not present

## 2019-03-16 IMAGING — DX DG CHEST 2V
2 series · 4 of 4 positions shown · non-contrast
Comparison: December 06, 2015

CLINICAL DATA: Shortness of breath.

EXAM:
CHEST  2 VIEW

[Series 2: chest lat · 0.14mm/px · 2 of 2 slices shown]
[im 1/2]
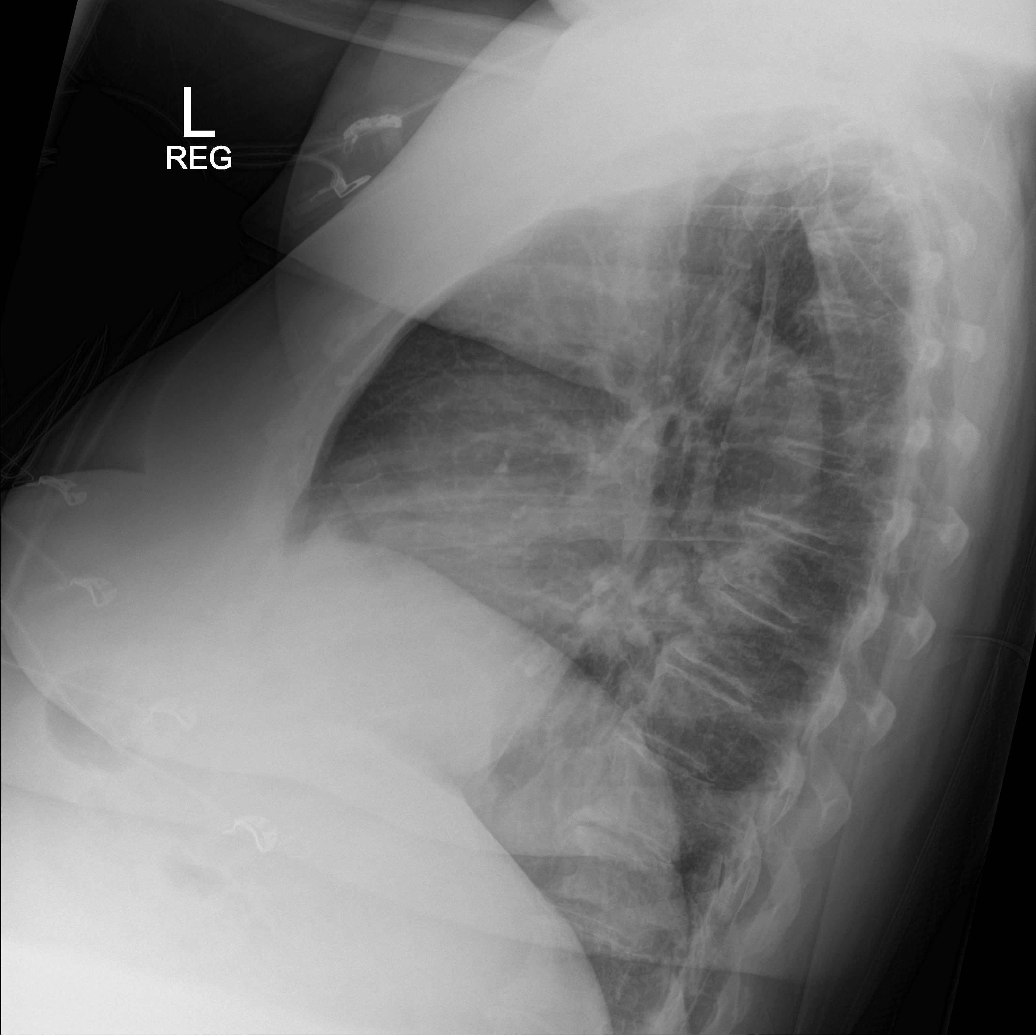
[im 2/2]
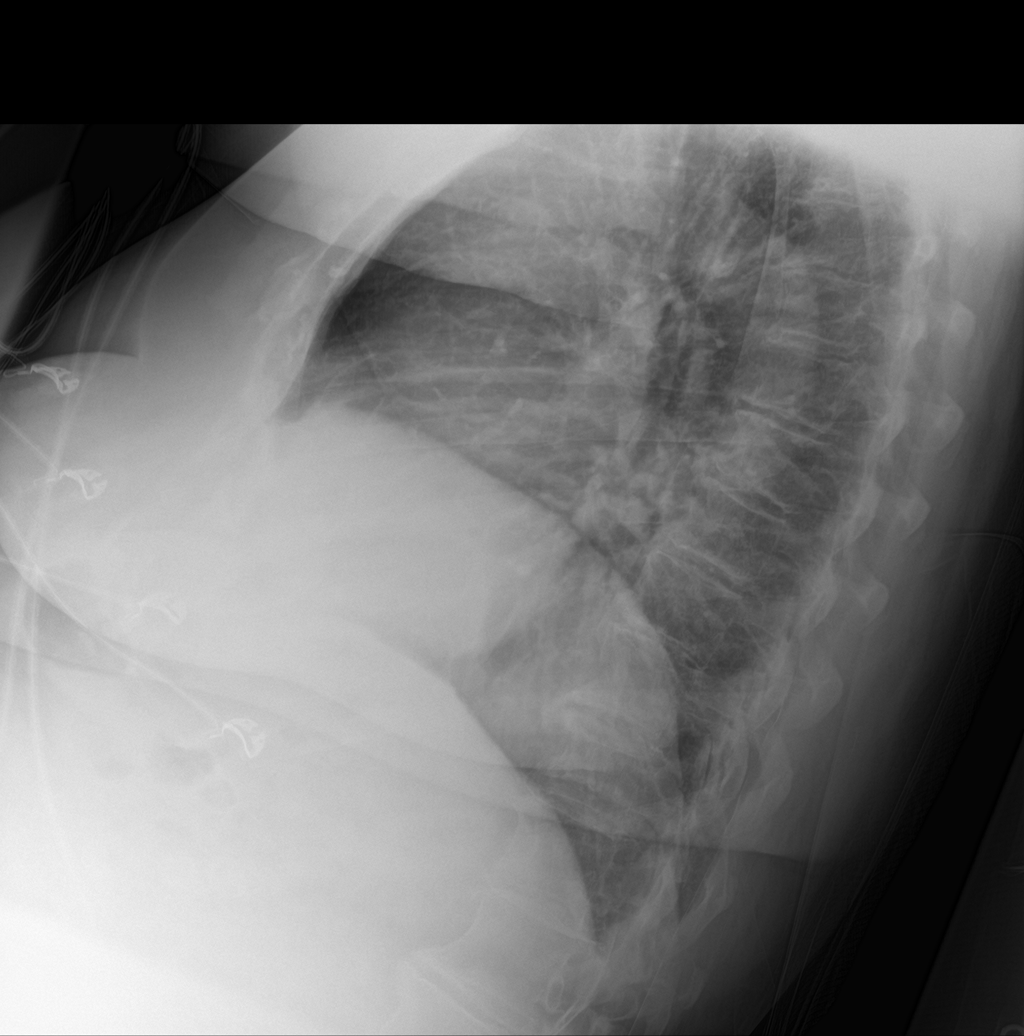

[Series 4: chest ap strecther · 0.14mm/px · 2 of 2 slices shown]
[im 1/2]
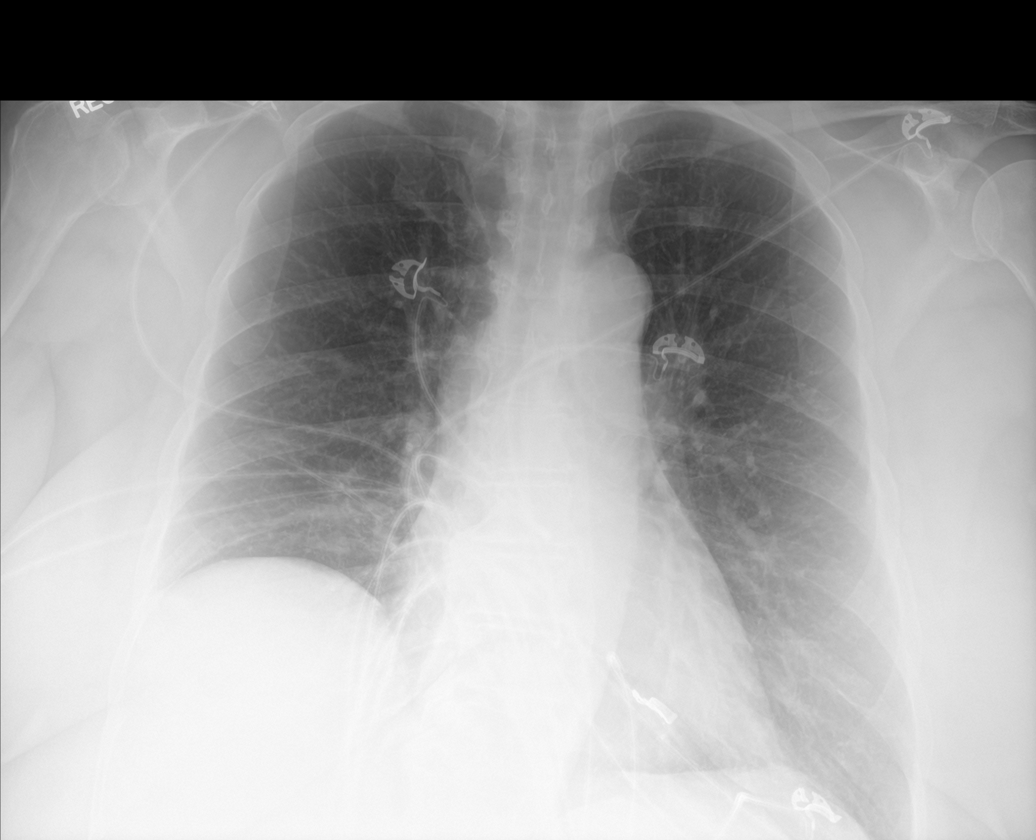
[im 2/2]
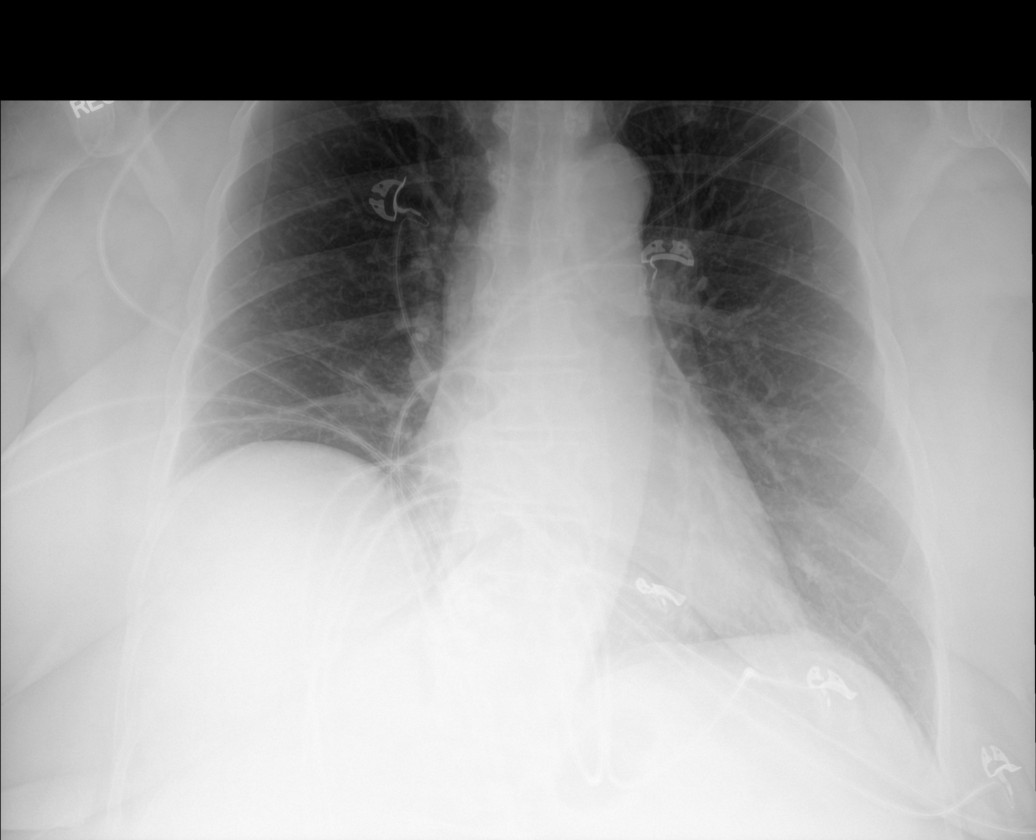

[4 of 4 positions shown; findings below may reference images not displayed]

FINDINGS: There is elevation of the right hemidiaphragm which is more
prominent when compared to 1921. The heart, hila, and mediastinum
are normal. No pneumothorax. Mild streaky opacity in the left base.
Degenerative changes of the first costochondral junctions. A small
nodular appearance projected over the left first costochondral
junction on 2 images may be part of the degenerative changes. On the
second image, this finding is symmetric to the right. No other acute
abnormalities.
IMPRESSION: 1. Increasing elevation of the right hemidiaphragm compared to the
previous study.
2. Mild streaky opacity in the left base may represent atelectasis
or early infiltrate.
3. Small nodular appearance over the left first costal chondral
junction is nonspecific but may be associated with degenerative
change at the junction. A CT scan could exclude an underlying
nodule.

## 2019-05-24 ENCOUNTER — Ambulatory Visit: Payer: Medicare Other | Attending: Internal Medicine

## 2019-05-24 DIAGNOSIS — Z20822 Contact with and (suspected) exposure to covid-19: Secondary | ICD-10-CM

## 2019-05-25 LAB — NOVEL CORONAVIRUS, NAA: SARS-CoV-2, NAA: NOT DETECTED

## 2019-06-30 ENCOUNTER — Encounter: Payer: Self-pay | Admitting: General Practice

## 2021-11-22 IMAGING — DX DG CHEST 2V
2 series · 2 of 2 positions shown · non-contrast
Comparison: Chest x-ray 05/30/2017. CT 11/06/2016.

CLINICAL DATA: Asthma.  Prior COVID infection.

EXAM:
CHEST - 2 VIEW

[chest pa]
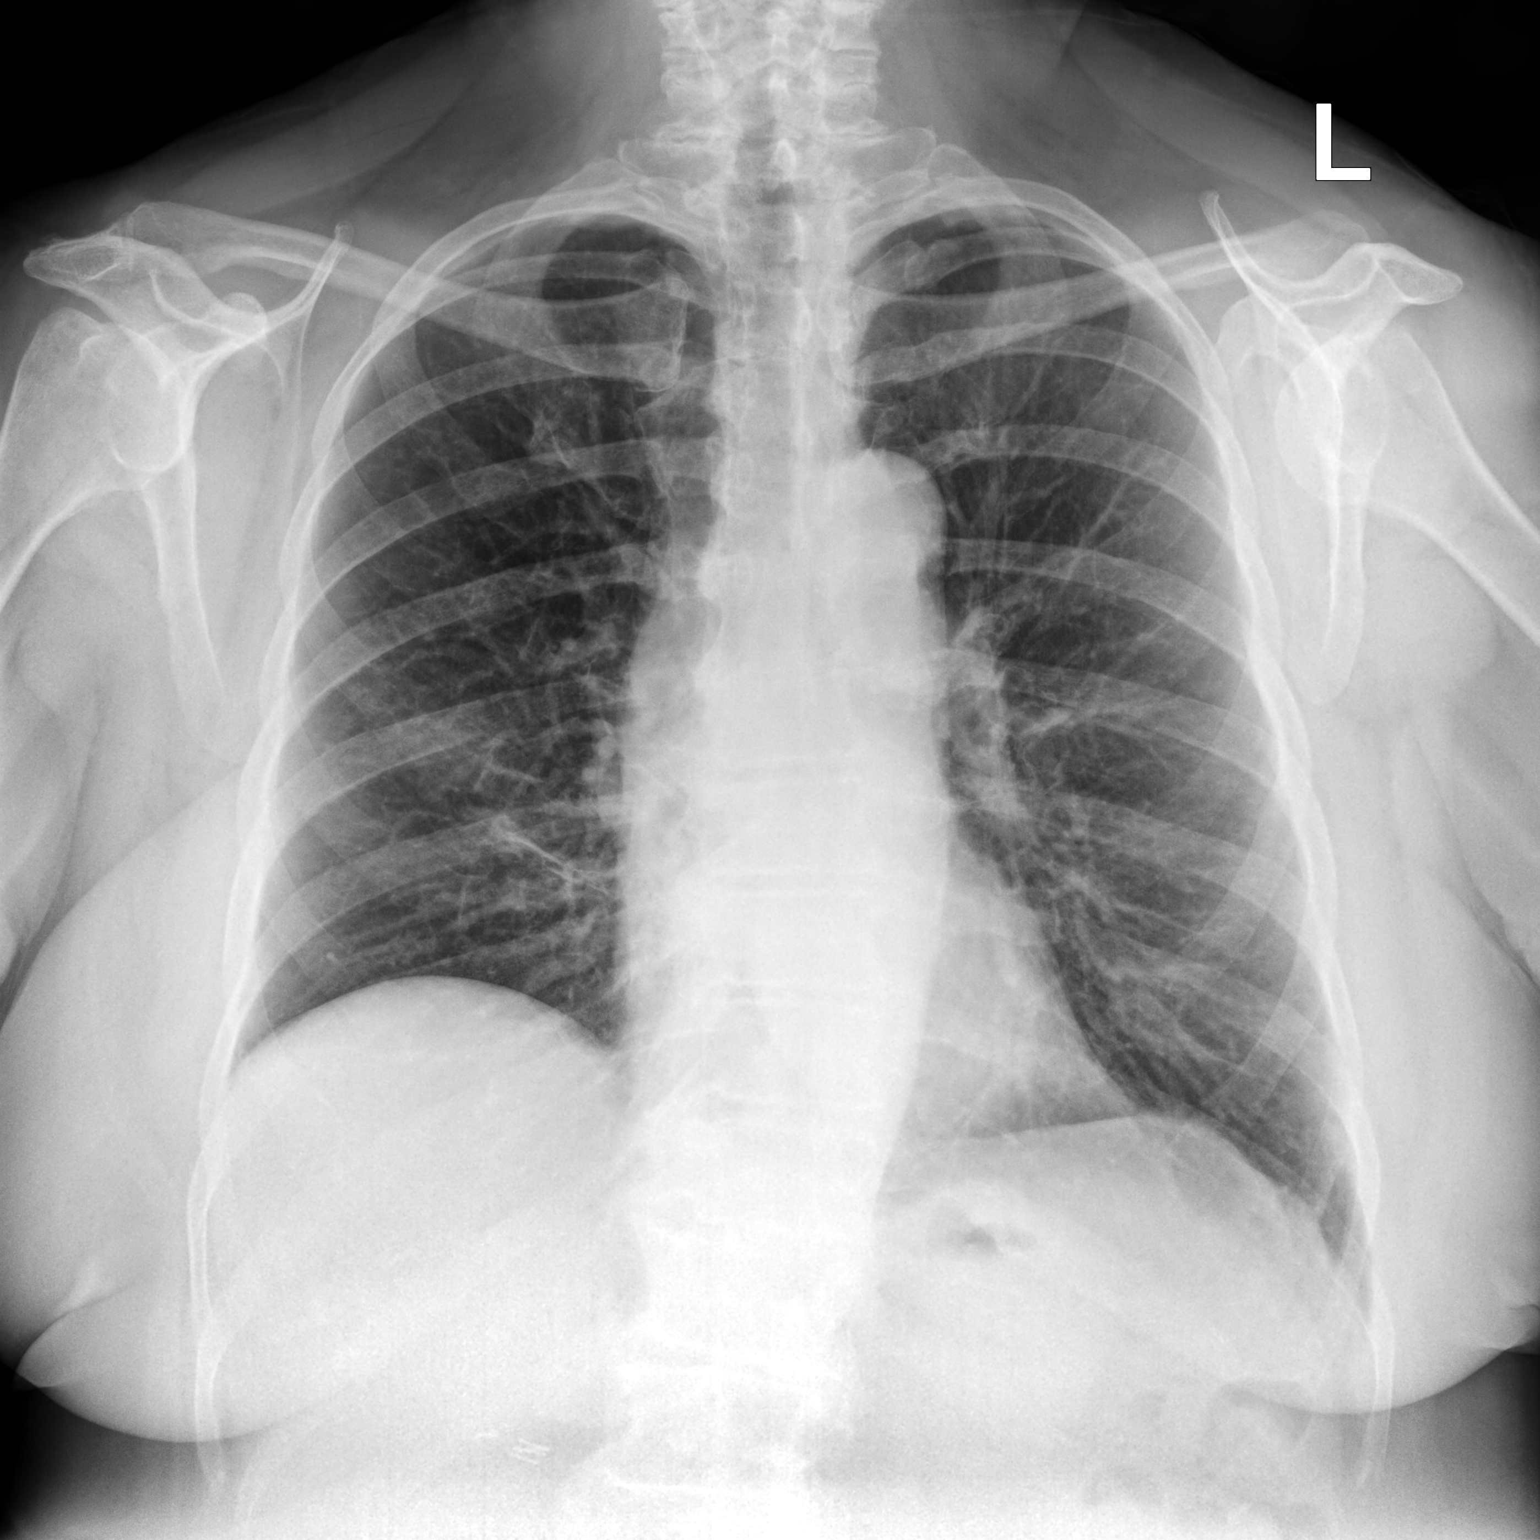

[chest lat]
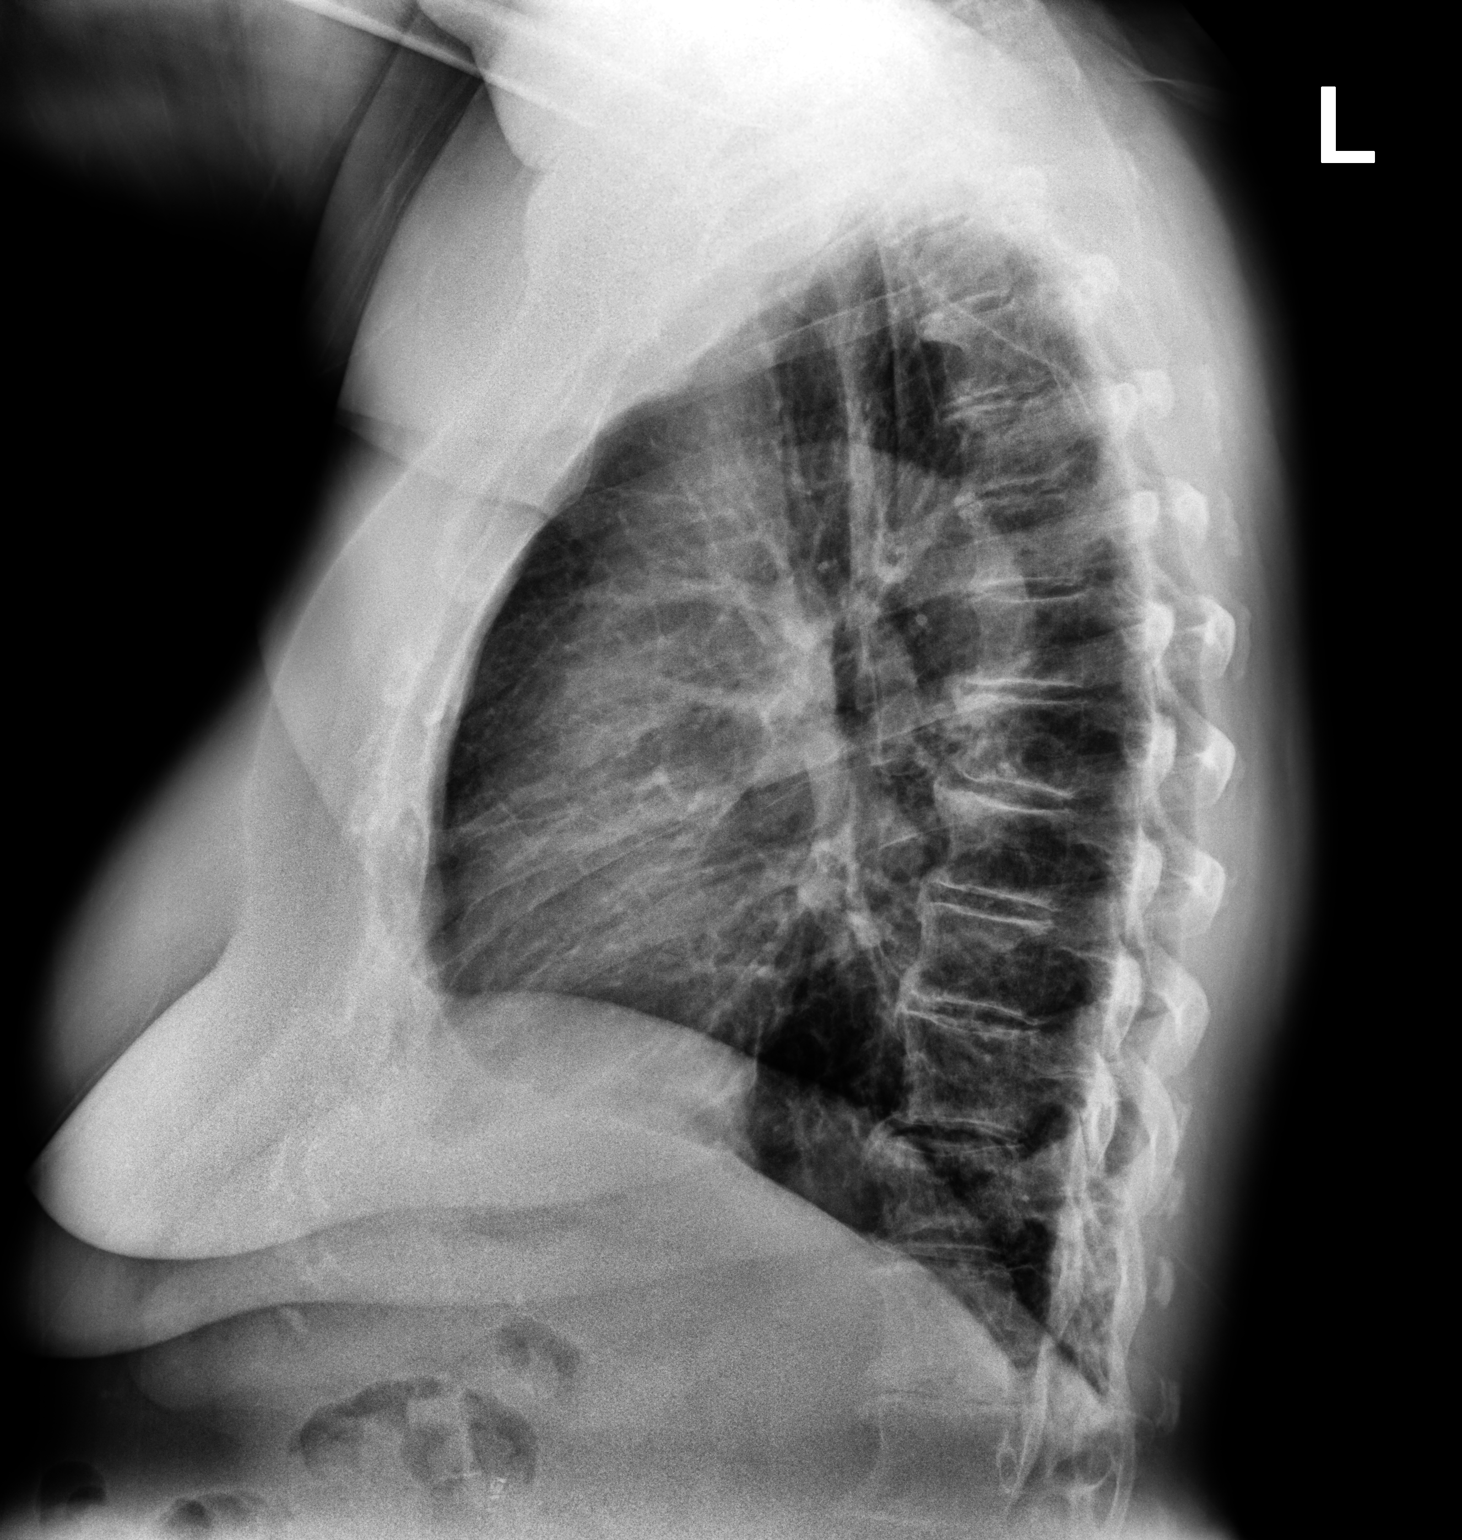

[2 of 2 positions shown; findings below may reference images not displayed]

FINDINGS: Mediastinum and hilar structures normal. Stable right base
subsegmental atelectasis and or scarring again noted. No pleural
effusion or pneumothorax. Heart size normal. No acute bony
abnormality identified. Surgical clips right upper quadrant.
Degenerative change thoracic spine.
IMPRESSION: Right base subsegmental atelectasis and or scarring again noted. No
interim change.

## 2023-05-20 ENCOUNTER — Other Ambulatory Visit: Payer: Self-pay | Admitting: Hematology and Oncology

## 2023-05-20 DIAGNOSIS — Z1231 Encounter for screening mammogram for malignant neoplasm of breast: Secondary | ICD-10-CM

## 2023-06-18 ENCOUNTER — Ambulatory Visit
Admission: RE | Admit: 2023-06-18 | Discharge: 2023-06-18 | Disposition: A | Discharge status: Home / Self Care | Source: Ambulatory Visit | Attending: Hematology and Oncology | Admitting: Hematology and Oncology

## 2023-06-18 DIAGNOSIS — Z1231 Encounter for screening mammogram for malignant neoplasm of breast: Secondary | ICD-10-CM
